# Patient Record
Sex: Female | Born: 1970 | Race: Black or African American | Hispanic: No | Marital: Married | Smoking: Never smoker
Health system: Southern US, Community
[De-identification: ages and names within clinical notes are randomized; demographics above are authoritative.]

## PROBLEM LIST (undated history)

## (undated) DIAGNOSIS — E663 Overweight: Secondary | ICD-10-CM

## (undated) DIAGNOSIS — R51 Headache: Secondary | ICD-10-CM

## (undated) DIAGNOSIS — D709 Neutropenia, unspecified: Secondary | ICD-10-CM

## (undated) DIAGNOSIS — Z0000 Encounter for general adult medical examination without abnormal findings: Secondary | ICD-10-CM

## (undated) DIAGNOSIS — F418 Other specified anxiety disorders: Secondary | ICD-10-CM

## (undated) DIAGNOSIS — F419 Anxiety disorder, unspecified: Secondary | ICD-10-CM

## (undated) DIAGNOSIS — D649 Anemia, unspecified: Secondary | ICD-10-CM

## (undated) DIAGNOSIS — F329 Major depressive disorder, single episode, unspecified: Secondary | ICD-10-CM

## (undated) DIAGNOSIS — Z124 Encounter for screening for malignant neoplasm of cervix: Principal | ICD-10-CM

## (undated) DIAGNOSIS — R030 Elevated blood-pressure reading, without diagnosis of hypertension: Secondary | ICD-10-CM

## (undated) HISTORY — PX: TUBAL LIGATION: SHX77

## (undated) HISTORY — DX: Elevated blood-pressure reading, without diagnosis of hypertension: R03.0

## (undated) HISTORY — DX: Major depressive disorder, single episode, unspecified: F32.9

## (undated) HISTORY — DX: Encounter for general adult medical examination without abnormal findings: Z00.00

## (undated) HISTORY — DX: Overweight: E66.3

## (undated) HISTORY — DX: Encounter for screening for malignant neoplasm of cervix: Z12.4

## (undated) HISTORY — DX: Anemia, unspecified: D64.9

## (undated) HISTORY — DX: Other specified anxiety disorders: F41.8

## (undated) HISTORY — DX: Neutropenia, unspecified: D70.9

---

## 2004-12-16 ENCOUNTER — Other Ambulatory Visit: Admission: RE | Admit: 2004-12-16 | Discharge: 2004-12-16 | Payer: Self-pay | Admitting: Gynecology

## 2005-04-03 ENCOUNTER — Ambulatory Visit (HOSPITAL_COMMUNITY): Admission: RE | Admit: 2005-04-03 | Discharge: 2005-04-03 | Payer: Self-pay | Admitting: Obstetrics & Gynecology

## 2005-06-20 ENCOUNTER — Inpatient Hospital Stay (HOSPITAL_COMMUNITY): Admission: AD | Admit: 2005-06-20 | Discharge: 2005-06-20 | Payer: Self-pay | Admitting: Obstetrics & Gynecology

## 2005-06-25 ENCOUNTER — Inpatient Hospital Stay (HOSPITAL_COMMUNITY): Admission: AD | Admit: 2005-06-25 | Discharge: 2005-06-28 | Payer: Self-pay | Admitting: Obstetrics and Gynecology

## 2007-07-29 ENCOUNTER — Other Ambulatory Visit: Admission: RE | Admit: 2007-07-29 | Discharge: 2007-07-29 | Payer: Self-pay | Admitting: Obstetrics & Gynecology

## 2007-07-29 ENCOUNTER — Encounter (INDEPENDENT_AMBULATORY_CARE_PROVIDER_SITE_OTHER): Payer: Self-pay | Admitting: *Deleted

## 2007-07-29 LAB — CONVERTED CEMR LAB: TSH: 0.842 microintl units/mL

## 2007-08-04 ENCOUNTER — Encounter: Admission: RE | Admit: 2007-08-04 | Discharge: 2007-08-04 | Payer: Self-pay | Admitting: Obstetrics & Gynecology

## 2008-09-12 ENCOUNTER — Inpatient Hospital Stay (HOSPITAL_COMMUNITY): Admission: AD | Admit: 2008-09-12 | Discharge: 2008-09-12 | Payer: Self-pay | Admitting: Obstetrics and Gynecology

## 2008-12-22 ENCOUNTER — Inpatient Hospital Stay (HOSPITAL_COMMUNITY): Admission: AD | Admit: 2008-12-22 | Discharge: 2008-12-25 | Payer: Self-pay | Admitting: Obstetrics and Gynecology

## 2008-12-22 ENCOUNTER — Encounter (INDEPENDENT_AMBULATORY_CARE_PROVIDER_SITE_OTHER): Payer: Self-pay | Admitting: Obstetrics and Gynecology

## 2009-08-16 IMAGING — US US SOFT TISSUE HEAD/NECK
1 series · 14 of 25 positions shown · non-contrast
Comparison: None

CLINICAL DATA: Enlarged thyroid gland by physical examination.

THYROID ULTRASOUND
TECHNIQUE: Ultrasound examination of the thyroid gland and adjacent
soft tissues was performed.

[Series 1: us soft tissue head/neck · 0.07mm/px · 14 of 28 slices shown]
[im 1/28]
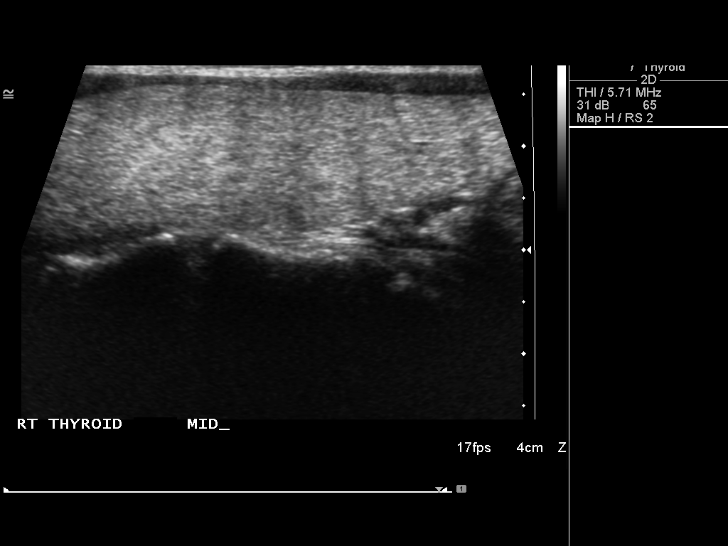
[im 3/28]
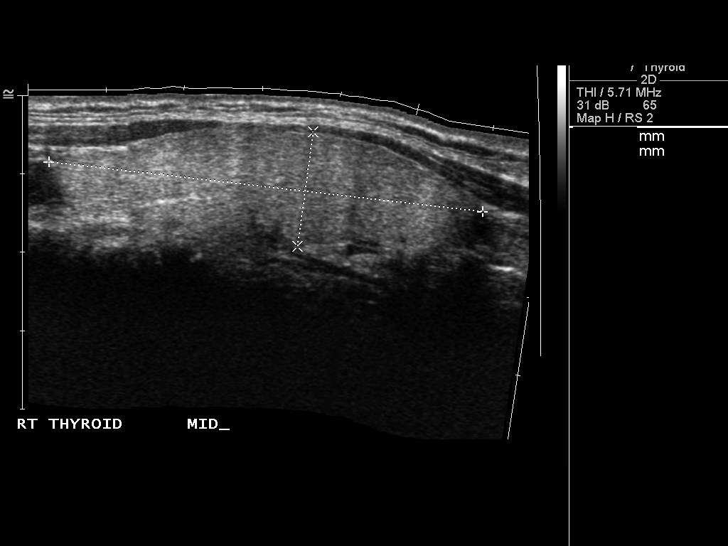
[im 5/28]
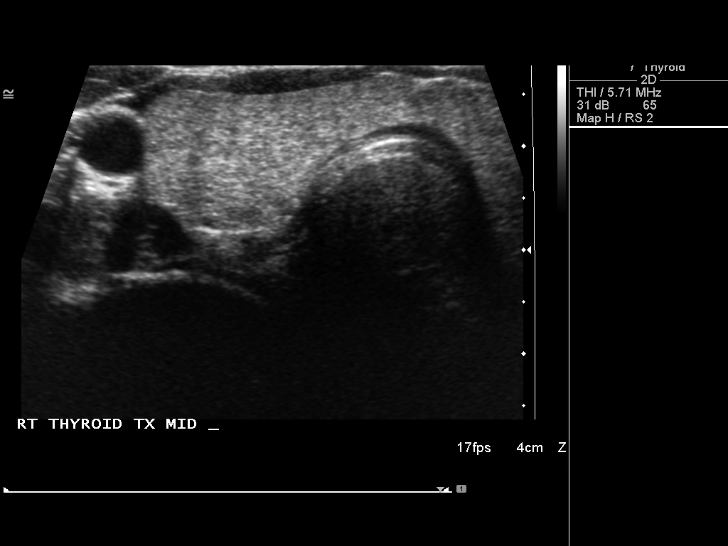
[im 7/28]
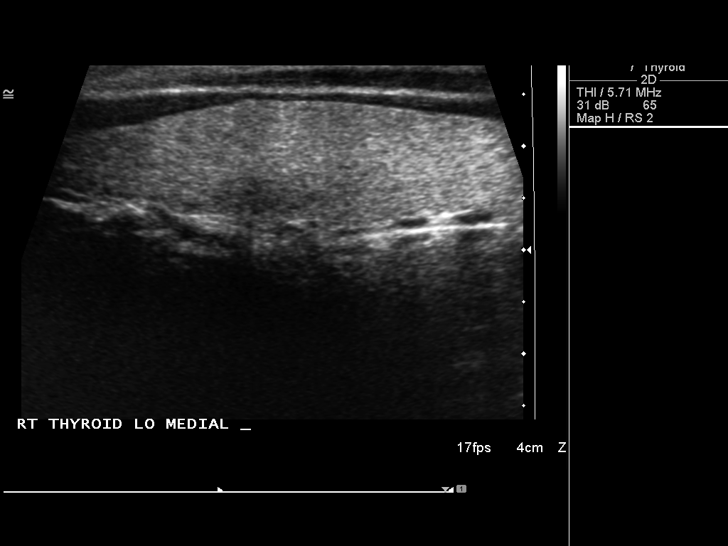
[im 10/28]
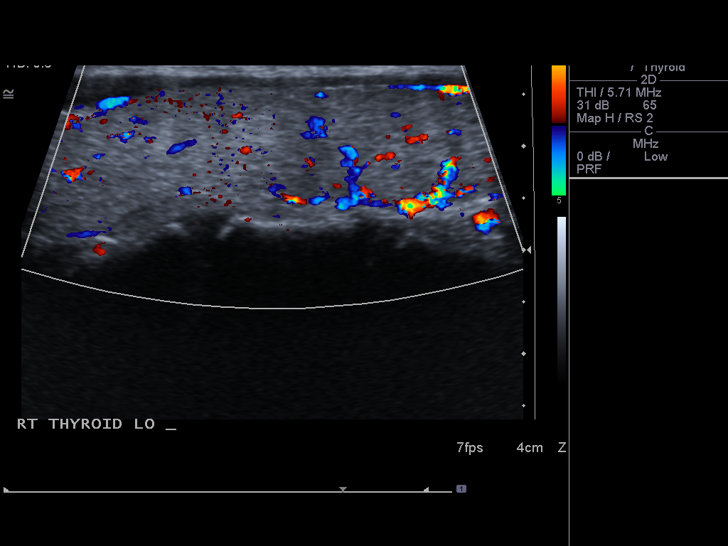
[im 11/28]
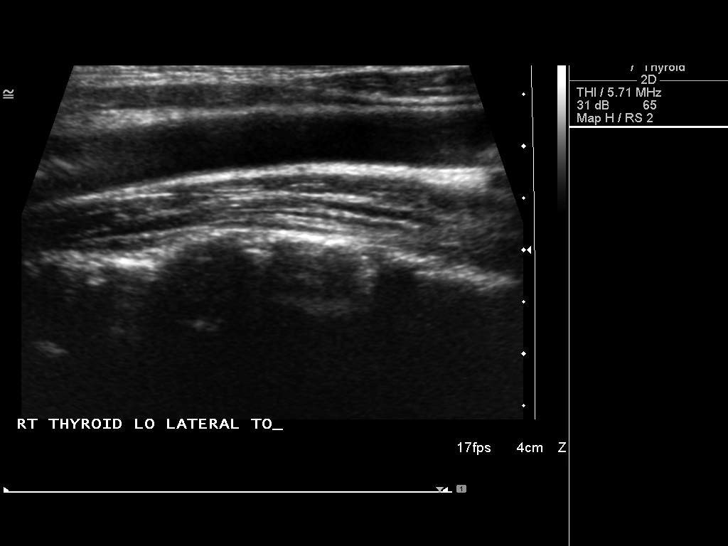
[im 13/28]
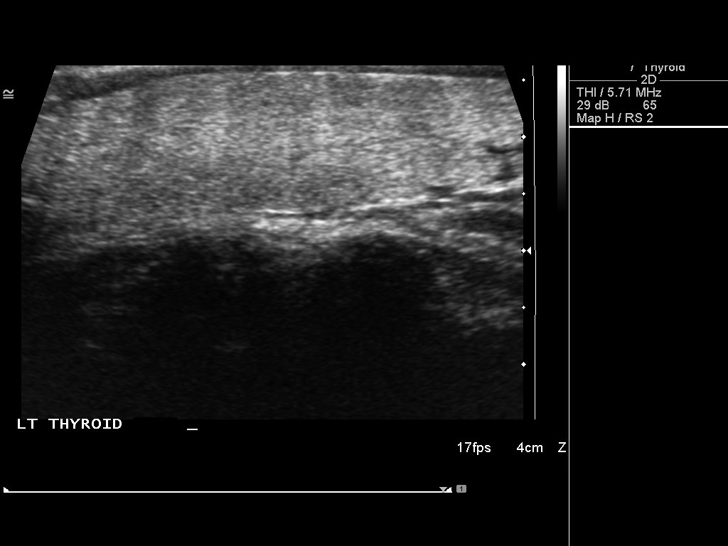
[im 15/28]
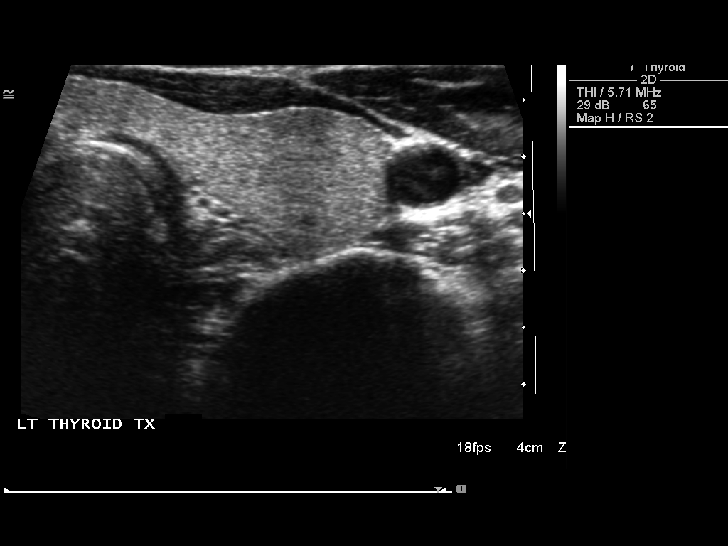
[im 17/28]
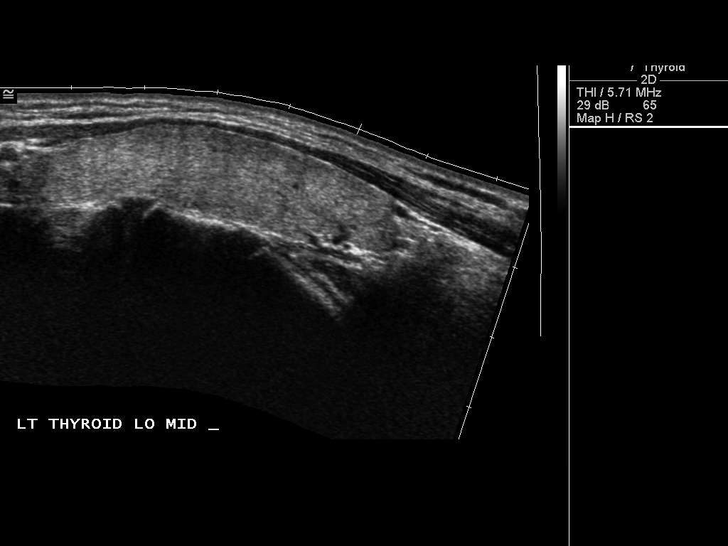
[im 19/28]
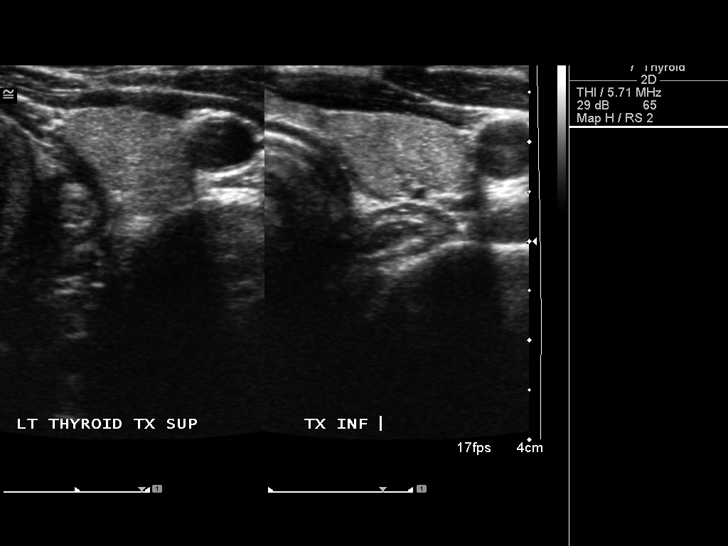
[im 21/28]
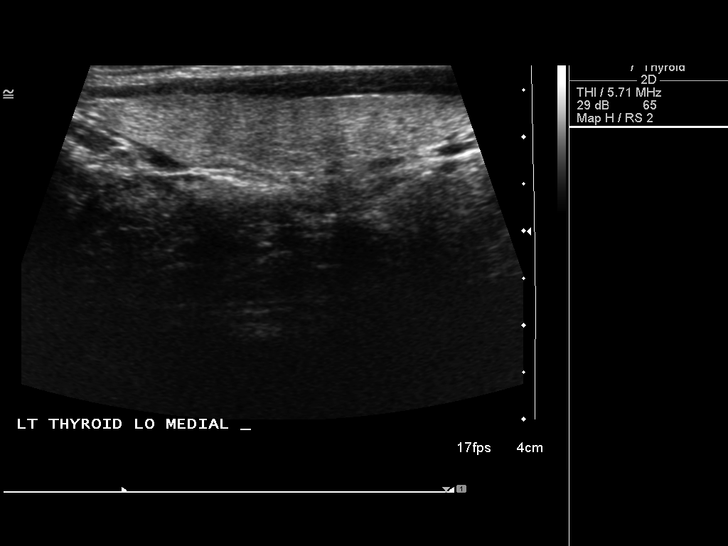
[im 23/28]
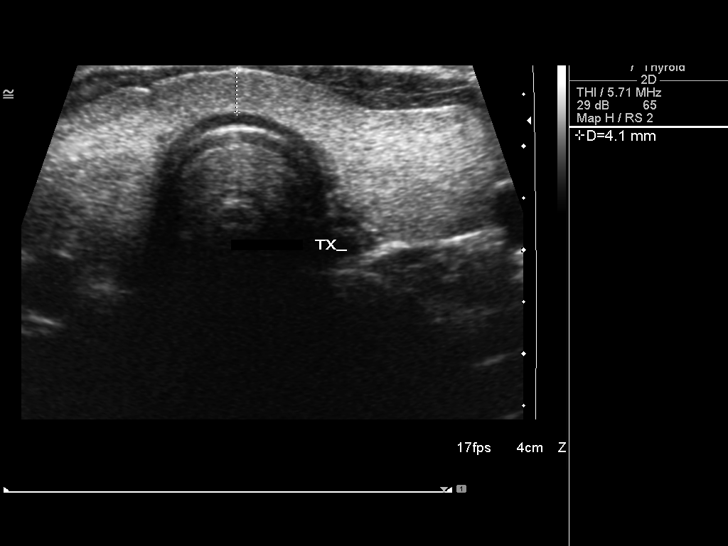
[im 25/28]
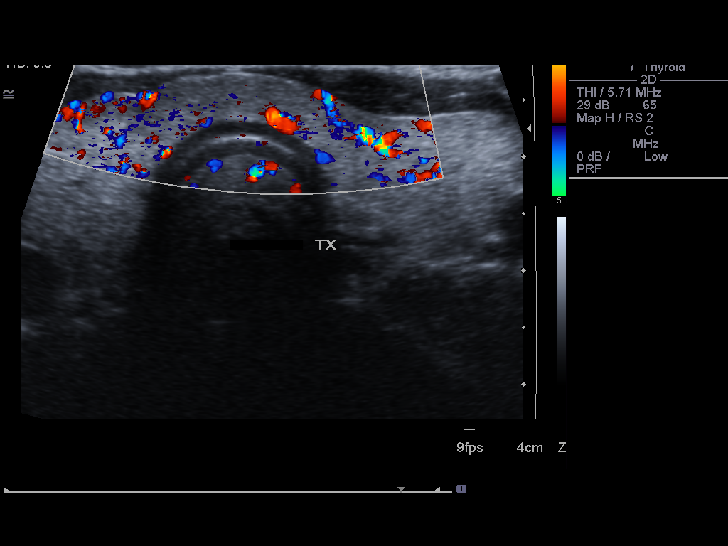
[im 28/28]
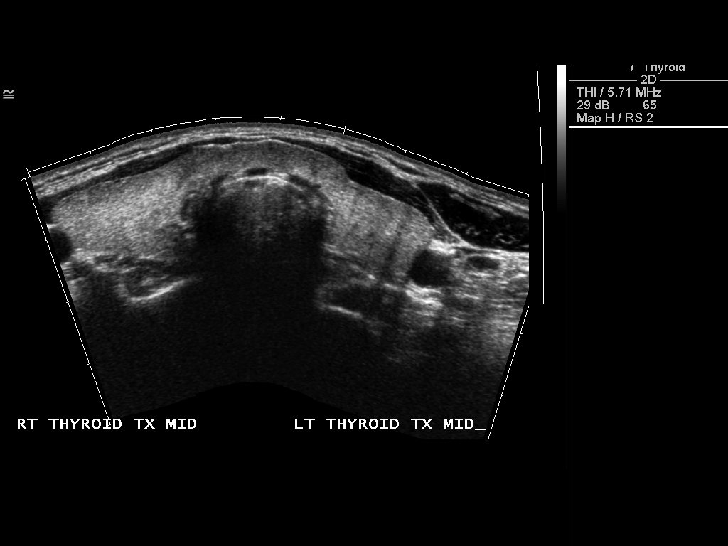

[14 of 25 positions shown; findings below may reference images not displayed]

FINDINGS: The thyroid gland is mildly and diffusely prominent in
size.  The right lobe measures 5.6 x 1.5 x 1.7 cm.  The left lobe
measures 5.4 x 1.2 x 1.8 cm.  The thyroid isthmus measures 4 mm in
thickness in the midline.

Thyroid echotexture is homogeneous.  No focal nodules or cysts are
identified.  No evidence of calcifications in the thyroid gland.
IMPRESSION: Mildly and diffusely enlarged thyroid gland with no focal lesions
identified.

## 2009-10-21 LAB — CONVERTED CEMR LAB

## 2009-11-21 LAB — HM PAP SMEAR

## 2009-12-09 ENCOUNTER — Encounter (INDEPENDENT_AMBULATORY_CARE_PROVIDER_SITE_OTHER): Payer: Self-pay | Admitting: *Deleted

## 2009-12-09 ENCOUNTER — Encounter: Payer: Self-pay | Admitting: Family Medicine

## 2009-12-09 LAB — HM MAMMOGRAPHY

## 2009-12-09 LAB — CONVERTED CEMR LAB: T3, Free: 34.8 pg/mL

## 2010-04-02 ENCOUNTER — Encounter: Payer: Self-pay | Admitting: Family Medicine

## 2010-04-02 ENCOUNTER — Ambulatory Visit
Admission: RE | Admit: 2010-04-02 | Discharge: 2010-04-02 | Payer: Self-pay | Source: Home / Self Care | Attending: Family Medicine | Admitting: Family Medicine

## 2010-04-02 DIAGNOSIS — E049 Nontoxic goiter, unspecified: Secondary | ICD-10-CM | POA: Insufficient documentation

## 2010-04-02 DIAGNOSIS — G43909 Migraine, unspecified, not intractable, without status migrainosus: Secondary | ICD-10-CM | POA: Insufficient documentation

## 2010-04-02 DIAGNOSIS — R112 Nausea with vomiting, unspecified: Secondary | ICD-10-CM | POA: Insufficient documentation

## 2010-04-02 DIAGNOSIS — Z8679 Personal history of other diseases of the circulatory system: Secondary | ICD-10-CM | POA: Insufficient documentation

## 2010-04-02 DIAGNOSIS — Z9189 Other specified personal risk factors, not elsewhere classified: Secondary | ICD-10-CM | POA: Insufficient documentation

## 2010-04-02 DIAGNOSIS — M6740 Ganglion, unspecified site: Secondary | ICD-10-CM | POA: Insufficient documentation

## 2010-04-02 DIAGNOSIS — L919 Hypertrophic disorder of the skin, unspecified: Secondary | ICD-10-CM

## 2010-04-02 DIAGNOSIS — L909 Atrophic disorder of skin, unspecified: Secondary | ICD-10-CM | POA: Insufficient documentation

## 2010-04-07 ENCOUNTER — Encounter (INDEPENDENT_AMBULATORY_CARE_PROVIDER_SITE_OTHER): Payer: Self-pay | Admitting: *Deleted

## 2010-04-07 ENCOUNTER — Telehealth (INDEPENDENT_AMBULATORY_CARE_PROVIDER_SITE_OTHER): Payer: Self-pay | Admitting: *Deleted

## 2010-04-24 NOTE — Progress Notes (Signed)
Summary: Lab results    Mammogram  Procedure date:  12/09/2009  Findings:       Assessment: BIRADS 1.   Comments:      Screening mammogram in 1 year.     -  Date:  12/09/2009    TSH: 0.685    Free T4 10.9    Free T3 34.8  Date:  07/29/2007    TSH: 0.842    Free T4 9.8    Free T3 35.7

## 2010-04-24 NOTE — Assessment & Plan Note (Signed)
Summary: migraine,vfw   Vital Signs:  Patient profile:   41 year old female Menstrual status:  regular LMP:     03/17/2010 Height:      67 inches (170.18 cm) Weight:      144.75 pounds (65.80 kg) BMI:     22.75 O2 Sat:      100 % on Room air Temp:     98.0 degrees F (36.67 degrees C) oral Pulse rate:   71 / minute BP sitting:   122 / 77  (right arm) Cuff size:   regular  Vitals Entered By: Josph Macho RMA (April 02, 2010 8:32 AM)  O2 Flow:  Room air CC: Establish new patient/ CF Is Patient Diabetic? No LMP (date): 03/17/2010     Menstrual Status regular Enter LMP: 03/17/2010 Last PAP Result historical   History of Present Illness: 40 year old American female in today for new patient appointment. She has multiple concerns. She has previously not had PND but instead has seen her OB/GYN Dr. Janae Sauce [facility] for annual exams. She denies any abnormal Paps. Had her first mammogram in August 2001 was normal. She notes her OB had noted an enlarged thyroid. After lab work, ultrasound, scans and the results showed just a benign goiter she's had no symptoms. She denies any skin changes, hair changes, temperature instability, bowel changes such as diarrhea or constipation, palpitations, chest pain, shortness of breath, anxiety or further concerns. Today she denies any recent illness, fevers, chills, GI or GU complaints. Her other concern is are a right ganglion cyst she's had for more than 4 years. She noticed it enlarges and shrinks he sometimes painful and at present is mildly uncomfortable with flexion. No redness, trauma, warmth. She also notes irritated skin tape the base of her neck on the right side which is growing and catches on her clothing in 2 weeks at present. Results in pain and irritation. She was told in 2003 heart murmur or further echo at that time her vital just mild murmur and she has had no further followup studies. She does have migraines first in 2007 frequency  drop from one every couple weeks to a frequent in 2009. She does not 2010 she had one significant enough to almost took her to the ER. She suffers with photophobia, phonophobia, floaters, nausea, vomiting, diarrhea. She uses Advil p.r.n. when she can keep it down. Has used Phenergan in the past with some good effect but takes infrequently do to sedation  Preventive Screening-Counseling & Management  Alcohol-Tobacco     Smoking Status: never  Caffeine-Diet-Exercise     Does Patient Exercise: yes      Drug Use:  no.    Current Medications (verified): 1)  None  Allergies (verified): 1)  ! Sulfa 2)  ! Doxycycline 3)  ! Erythromycin  Past History:  Past Surgical History: Caesarean section w/tubal  Family History: Father: 49, HTN, torn retinas Mother: deceased@56 , sarcoma, gyn cancer noted w/hysterectomy Siblings:  Sister: 6, A&W Brother: 10, Asthma, smoker MGM: deceased@90 , multiple myeloma MGF: deceased@92 , prostate cancer, old age PGM: late 40s, HTN PGF: 51s, glaucoma Children:  Daughter: 18yo, A&W Son: 2yo, A&W PUncle: early 59s, torn retinas  Social History: Occupation: stay at home Mom, previously Engineer, structural Married Never Smoked Alcohol use-no Drug use-no Regular exercise-yes, treadmill Seat belt use No restrictions Smoking Status:  never Occupation:  employed Drug Use:  no Does Patient Exercise:  yes  Review of Systems       The patient  complains of vision loss.  The patient denies anorexia, fever, weight loss, weight gain, decreased hearing, hoarseness, chest pain, syncope, dyspnea on exertion, peripheral edema, prolonged cough, headaches, hemoptysis, abdominal pain, melena, hematochezia, severe indigestion/heartburn, hematuria, incontinence, genital sores, muscle weakness, suspicious skin lesions, transient blindness, difficulty walking, unusual weight change, abnormal bleeding, enlarged lymph nodes, and breast masses.    Physical Exam  General:   Well-developed,well-nourished,in no acute distress; alert,appropriate and cooperative throughout examination Head:  Normocephalic and atraumatic without obvious abnormalities. No apparent alopecia or balding. Eyes:  No corneal or conjunctival inflammation noted. EOMI.  PERRLA Ears:  External ear exam shows no significant lesions or deformities.  Otoscopic examination reveals clear canals, tympanic membranes are intact bilaterally without bulging, retraction, inflammation or discharge. Hearing is grossly normal bilaterally. Nose:  External nasal examination shows no deformity or inflammation. Nasal mucosa are pink and moist without lesions or exudates. Mouth:  Oral mucosa and oropharynx without lesions or exudates.  Teeth in good repair. Neck:  No deformities,  or tenderness noted. Lungs:  Normal respiratory effort, chest expands symmetrically. Lungs are clear to auscultation, no crackles or wheezes. Heart:  Normal rate and regular rhythm. S1 and S2 normal without gallop, murmur, click, rub or other extra sounds. Abdomen:  Bowel sounds positive,abdomen soft and non-tender without masses, organomegaly or hernias noted. Msk:  No deformity or scoliosis noted of thoracic or lumbar spine.   Pulses:  R and L carotid,radial,femoral,dorsalis pedis and posterior tibial pulses are full and equal bilaterally Extremities:  No clubbing, cyanosis, edema, or deformity noted with normal full range of motion of all joints.  small ganglion cyst, firm, minimal mobile on dorsal surface of right wrist, at thenar aspect of wrist. 1/2 to 1 cm in diameter, no erythema, tenderness Neurologic:  No cranial nerve deficits noted. Station and gait are normal. Plantar reflexes are down-going bilaterally. DTRs are symmetrical throughout. Sensory, motor and coordinative functions appear intact. Skin:  Intact without suspicious lesions or rashes. Small pedunculated skin tag with some mild erythema at the base noted over right lateral  neck at base Cervical Nodes:  No lymphadenopathy noted Psych:  Cognition and judgment appear intact. Alert and cooperative with normal attention span and concentration. No apparent delusions, illusions, hallucinations   Detailed Thyroid Exam    Inspection: visible goiter.      Palpation: diffuse goiter and soft.      Mobility: moves upward normally with swallowing with no tethering.     Impression & Recommendations:  Problem # 1:  UNSPECIFIED HYPERTROPHIC&ATROPHIC CONDITION SKIN (ICD-701.9) Lesion at right side base of neck, cleaned with alcohol, bubjected to cryotherapy and then excised with sterile scissors, patient tolerated the procedure well. Lesion cleaned again with alcohol and very scant bleeding was stopped with one application of silver nitrite. Sterile bandage applied  Problem # 2:  NAUSEA WITH VOMITING (ICD-787.01) Patient with significant symptoms when she has migraines encouraged to try Excedrine Migraine or Advil early in the symptoms, given an rx for some Promethazine supp to use as needed for the nausea and vomitting  Problem # 3:  GANGLION CYST, WRIST, RIGHT (ICD-727.41) Encouraged ice two times a day and vigorous massage with Aspercreme following this if no resolution or symptoms worsening may call for referral and further instructions  Problem # 4:  GOITER, UNSPECIFIED (ICD-240.9) No concerning findings on exam patient agrees to release of records from her OB/GYN to further evaluate and we will repeat TSH when she returns for fasting annual exams prior to  next visit.  Complete Medication List: 1)  Promethazine Hcl 25 Mg Supp (Promethazine hcl) .Marland Kitchen.. 1 supp pr q 8 hours as needed n/v  Patient Instructions: 1)  Please schedule a follow-up appointment in 1 to 2 months 2)  Release of Records [facility] in [place] 3)  Release of Records Dr Darl Pikes (?) [facility] at [facility], labs 4)  BMP prior to visit, ICD-9: use v 70.0 5)  Hepatic Panel prior to visit  ICD-9:  6)  Lipid panel prior to visit ICD-9 :  7)  TSH prior to visit ICD-9 :  8)  CBC w/ Diff prior to visit ICD-9 :  9)  For neck, shower normally and leave open to air, if bleeds may apply bandage as needed, call with any concerns, redness or swelling Prescriptions: PROMETHAZINE HCL 25 MG SUPP (PROMETHAZINE HCL) 1 supp pr q 8 hours as needed n/v  #10 x 1   Entered and Authorized by:   Danise Edge MD   Signed by:   Danise Edge MD on 04/02/2010   Method used:   Electronically to        [facility]  [address] [phone]* (retail)       [address] [place], [place]  [address]       Ph: [phone] or [phone]       Fax: [phone]   RxID:   [id number]    Orders Added: 1)  New Patient Level III [03474]    Preventive Care Screening  Mammogram:    Date:  10/21/2009    Results:  historical   Pap Smear:    Date:  10/21/2009    Results:  historical   Last Tetanus Booster:    Date:  03/23/2002    Results:  Historical    Appended Document: migraine,vfw Faxed medical record requests to [facility] & Dr Hyacinth Meeker - dt

## 2010-04-24 NOTE — Letter (Signed)
Summary: [facility]  [facility]   Imported By: Lanelle Bal 04/08/2010 11:20:06  _____________________________________________________________________  External Attachment:    Type:   Image     Comment:   External Document

## 2010-04-24 NOTE — Letter (Signed)
SummaryGinette Dunlap [facility]  [facility] 2009-2011   Imported By: Lester  04/11/2010 08:00:42  _____________________________________________________________________  External Attachment:    Type:   Image     Comment:   External Document

## 2010-04-25 ENCOUNTER — Encounter: Payer: Self-pay | Admitting: Family Medicine

## 2010-06-01 ENCOUNTER — Encounter: Payer: Self-pay | Admitting: Family Medicine

## 2010-06-10 ENCOUNTER — Ambulatory Visit: Payer: Self-pay | Admitting: Family Medicine

## 2010-06-26 LAB — CBC
HCT: 28.6 % — ABNORMAL LOW (ref 36.0–46.0)
HCT: 35.1 % — ABNORMAL LOW (ref 36.0–46.0)
MCV: 90.1 fL (ref 78.0–100.0)
MCV: 91.5 fL (ref 78.0–100.0)
Platelets: 185 10*3/uL (ref 150–400)
RBC: 3.13 MIL/uL — ABNORMAL LOW (ref 3.87–5.11)
RDW: 15.5 % (ref 11.5–15.5)

## 2010-08-07 ENCOUNTER — Ambulatory Visit (INDEPENDENT_AMBULATORY_CARE_PROVIDER_SITE_OTHER): Payer: 59 | Admitting: Family Medicine

## 2010-08-07 ENCOUNTER — Encounter: Payer: Self-pay | Admitting: Family Medicine

## 2010-08-07 DIAGNOSIS — F329 Major depressive disorder, single episode, unspecified: Secondary | ICD-10-CM

## 2010-08-07 DIAGNOSIS — R5383 Other fatigue: Secondary | ICD-10-CM

## 2010-08-07 DIAGNOSIS — R635 Abnormal weight gain: Secondary | ICD-10-CM

## 2010-08-07 DIAGNOSIS — E049 Nontoxic goiter, unspecified: Secondary | ICD-10-CM

## 2010-08-07 DIAGNOSIS — F3289 Other specified depressive episodes: Secondary | ICD-10-CM

## 2010-08-07 DIAGNOSIS — R112 Nausea with vomiting, unspecified: Secondary | ICD-10-CM

## 2010-08-07 DIAGNOSIS — R5381 Other malaise: Secondary | ICD-10-CM

## 2010-08-07 LAB — CBC WITH DIFFERENTIAL/PLATELET
Basophils Absolute: 0 10*3/uL (ref 0.0–0.1)
Basophils Relative: 0.5 % (ref 0.0–3.0)
Eosinophils Absolute: 0.2 10*3/uL (ref 0.0–0.7)
HCT: 37.2 % (ref 36.0–46.0)
Hemoglobin: 12.7 g/dL (ref 12.0–15.0)
Lymphs Abs: 1.8 10*3/uL (ref 0.7–4.0)
MCHC: 34.1 g/dL (ref 30.0–36.0)
MCV: 89.7 fl (ref 78.0–100.0)
Monocytes Absolute: 0.3 10*3/uL (ref 0.1–1.0)
Neutro Abs: 4.6 10*3/uL (ref 1.4–7.7)
RBC: 4.14 Mil/uL (ref 3.87–5.11)
RDW: 12.2 % (ref 11.5–14.6)

## 2010-08-07 LAB — RENAL FUNCTION PANEL
Albumin: 3.7 g/dL (ref 3.5–5.2)
BUN: 13 mg/dL (ref 6–23)
Chloride: 106 mEq/L (ref 96–112)
Creatinine, Ser: 0.6 mg/dL (ref 0.4–1.2)
Phosphorus: 3.5 mg/dL (ref 2.3–4.6)

## 2010-08-07 LAB — HEPATIC FUNCTION PANEL
Albumin: 3.7 g/dL (ref 3.5–5.2)
Alkaline Phosphatase: 61 U/L (ref 39–117)
Total Protein: 7.3 g/dL (ref 6.0–8.3)

## 2010-08-07 MED ORDER — CITALOPRAM HYDROBROMIDE 10 MG PO TABS: 10.0000 mg | ORAL_TABLET | Freq: Every day | ORAL | Status: DC

## 2010-08-07 MED ORDER — BUTALBITAL-ACETAMINOPHEN 50-650 MG PO TABS: 1.0000 | ORAL_TABLET | Freq: Four times a day (QID) | ORAL | Status: DC | PRN

## 2010-08-07 MED ORDER — BUTALBITAL-APAP-CAFFEINE 50-300-40 MG PO CAPS: 1.0000 | ORAL_CAPSULE | Freq: Four times a day (QID) | ORAL | Status: DC | PRN

## 2010-08-07 MED ORDER — ALPRAZOLAM 0.25 MG PO TABS: ORAL_TABLET | ORAL | Status: DC

## 2010-08-07 NOTE — Patient Instructions (Signed)
Depression You have signs of depression. This is a common problem. It can occur at any age. It is often hard to recognize. People can suffer from depression and still have moments of enjoyment. Depression interferes with your basic ability to function in life. It upsets your relationships, sleep, eating, and work habits. CAUSES Depression is believed to be caused by an imbalance in brain chemicals. It may be triggered by an unpleasant event. Relationship crises, a death in the family, financial worries, retirement, or other stressors are normal causes of depression. Depression may also start for no known reason. Other factors that may play a part include medical illnesses, some medicines, genetics, and alcohol or drug abuse. SYMPTOMS  Feeling unhappy or worthless.   Long-lasting (chronic) tiredness or worn-out feeling.   Self-destructive thoughts and actions.   Not being able to sleep or sleeping too much.   Eating more than usual or not eating at all.   Headaches or feeling anxious.   Trouble concentrating or making decisions.   Unexplained physical problems and substance abuse.  TREATMENT Depression usually gets better with treatment. This can include:  Antidepressant medicines. It can take weeks before the proper dose is achieved and benefits are reached.   Talking with a therapist, clergyperson, counselor, or friend. These people can help you gain insight into your problem and regain control of your life.   Eating a good diet.   Getting regular physical exercise, such as walking for 30 minutes every day.   Not abusing alcohol or drugs.  Treating depression often takes 6 months or longer. This length of treatment is needed to keep symptoms from returning. Call your caregiver and arrange for follow-up care as suggested. SEEK IMMEDIATE MEDICAL CARE IF:  You start to have thoughts of hurting yourself or others.   Call your local emergency services (911 in [place]   Go to your  local medical emergency department.   Call the National Suicide Prevention Lifeline: 1-800-273-TALK (1-800-273-8255).  Document Released: 03/09/2005 Document Re-Released: 08/27/2009 Exit[employer] Patient Information 2011 [employer]. 

## 2010-08-08 NOTE — Op Note (Signed)
NAMESILVERIA, Lynn Dunlap       ACCOUNT NO.:  [id number]   MEDICAL RECORD NO.:  [id number]          PATIENT TYPE:  INP   LOCATION:  9146                          FACILITY:  [facility]   PHYSICIAN:  Randye Lobo, M.D.   DATE OF BIRTH:  23-Sep-1970   DATE OF PROCEDURE:  06/26/2005  DATE OF DISCHARGE:                                 OPERATIVE REPORT   PREOPERATIVE DIAGNOSIS:  1.  Intrauterine gestation at 58 plus 6 weeks.  2.  Severe fetal variable decelerations   POSTOPERATIVE DIAGNOSIS:  1.  Intrauterine gestation at 4 plus 6 weeks.  2.  Severe fetal variable heart rate decelerations.   PROCEDURE:  Vacuum-assisted vaginal delivery with midline episiotomy and  repair.   SURGEON:  Randye Lobo, M.D.   ANESTHESIA:  Epidural.   ESTIMATED BLOOD LOSS:  350 mL.   COMPLICATIONS:  None.   INDICATIONS FOR PROCEDURE:  The patient is a 40 year old gravida 2, para 0-0-  1-0 African-American female who presented at 1 plus 6 weeks' gestation with  contractions, nausea and vomiting.  The patient was noted to be 3 cm dilated  upon admission.  The patient did receive some Phenergan for nausea.  She was  noted to have contractions and she was admitted in early labor.  The patient  went on to receive an epidural for anesthesia.  The patient progressed  throughout her active phase of labor and after receiving some Pitocin  augmentation.  Intermittently, the fetal heart rate did demonstrate deep  variable decelerations which were treated with resuscitation measures.   Ultimately, the severity of fetal heart rate decelerations did recur and the  patient was noted to be completely dilated at this time.  At this point, a  recommendation was made to proceed with a vacuum-assisted vaginal delivery  which was ultimately accompanied by a midline episiotomy.  The risks and  benefits were reviewed with the patient and she chose to proceed.   FINDINGS:  A viable female was delivered at 1324 in the left  occiput  anterior position with a foot cord appreciated.  Apgars were 9 at 1 minute  and 9 at 5 minutes.  The newborn was noted to be vigorous.  The placenta was  noted to be intact and have a normal insertion of a three-vessel cord.  There was a partial third degree extension of the midline episiotomy.   PROCEDURE:  The patient was examined and noted to be completely dilated with  the vertex at a 2+ station in the left occiput anterior position.  The  patient was making only a moderate pushing effort.  The Foley catheter was  removed and the patient was prepared for a vacuum-assisted vaginal delivery.  The Mityvac was applied with the vertex at a 2+ station and was used over  two efforts.  The vertex was crowning at this time but the patient was  unable to deliver and a midline episiotomy was performed as the heart rate  was in the 70s at this time.  The patient was then able to push and  ultimately delivered the vertex spontaneously.  The remainder of the newborn  was delivered without difficulty.  The nares and mouth were suctioned.  The  cord was doubly clamped and cut and the newborn was carried over to the baby  warmer in good condition.   The placenta was delivered spontaneously and intact.  Cord blood was  obtained and a cord blood donation was also performed.  The cervix and  vagina were examined and noted to be intact.  There was a partial third  degree extension of the midline episiotomy which went through the capsule  and included a few of the muscle fibers.  A repair was performed using a  combination of 0 chromic and 2-0 chromic in standard fashion.   This concluded the patient's procedure.  There were no complications.  All  needle, instrument, sponge counts were correct.      Randye Lobo, M.D.  Electronically Signed     BES/MEDQ  D:  06/26/2005  T:  06/26/2005  Job:  [id number]

## 2010-08-10 ENCOUNTER — Encounter: Payer: Self-pay | Admitting: Family Medicine

## 2010-08-10 DIAGNOSIS — F32A Depression, unspecified: Secondary | ICD-10-CM

## 2010-08-10 DIAGNOSIS — F418 Other specified anxiety disorders: Secondary | ICD-10-CM

## 2010-08-10 HISTORY — DX: Depression, unspecified: F32.A

## 2010-08-10 HISTORY — DX: Other specified anxiety disorders: F41.8

## 2010-08-10 NOTE — Progress Notes (Signed)
Lynn Dunlap [id number] Jul 22, 1970 08/10/2010      Progress Note-Follow Up  Subjective  Chief Complaint  Chief Complaint  Patient presents with  . Follow-up    on medication    HPI  Patient is in today for follow up on her new patient appt. She is physically feeling better. She denies any GI c/o. No anorexia/n/v/abd pain/f/c/diarhea or constipation. Unfortunately she is very sad and tearful today. She has been unemployed for [employer] and now her husband has just gone back over seas to work in [place] for more than the next year. She is at home with her two young children and is struggling with depression. She denies any suicidal ideation or homicidal ideation. She is noting anhedonia, irritability and fatigue as her major concerns. She notes it has been getting worse over the past couple of months. She has never taken any meds for this in the past. She denies any CP/pain/SOB/GI or GU c/o.  Past Medical History  Diagnosis Date  . Depression 08/10/2010    Past Surgical History  Procedure Date  . Cesarean section     X 1 w/ tubal    Family History  Problem Relation Age of Onset  . Cancer Mother     sarcoma, gyn cancer w/ hysterectomy  . Hypertension Father   . Asthma Brother   . Cancer Maternal Grandfather     prostate  . Hyperlipidemia Paternal Grandmother   . Glaucoma Paternal Grandfather     History   Social History  . Marital Status: Married    Spouse Name: N/A    Number of Children: N/A  . Years of Education: N/A   Occupational History  . Not on file.   Social History Main Topics  . Smoking status: Never Smoker   . Smokeless tobacco: Never Used  . Alcohol Use: No  . Drug Use: No  . Sexually Active: Yes -- Female partner(s)   Other Topics Concern  . Not on file   Social History Narrative  . No narrative on file    Current Outpatient Prescriptions on File Prior to Visit  Medication Sig Dispense Refill  . promethazine (PHENERGAN) 25 MG  suppository Place 25 mg rectally every 8 (eight) hours as needed. For nausea and vomitting         Allergies  Allergen Reactions  . Doxycycline   . Erythromycin   . Sulfonamide Derivatives     Review of Systems  Review of Systems  Constitutional: Negative for fever and malaise/fatigue.  HENT: Negative for congestion.   Eyes: Negative for discharge.  Respiratory: Negative for shortness of breath.   Cardiovascular: Negative for chest pain, palpitations and leg swelling.  Gastrointestinal: Negative for nausea, abdominal pain and diarrhea.  Genitourinary: Negative for dysuria.  Musculoskeletal: Negative for falls.  Skin: Negative for rash.  Neurological: Negative for loss of consciousness and headaches.  Endo/Heme/Allergies: Negative for polydipsia.  Psychiatric/Behavioral: Positive for depression. Negative for suicidal ideas, memory loss and substance abuse. The patient has insomnia. The patient is not nervous/anxious.     Objective  BP 139/88  Pulse 76  Temp(Src) 97.9 F (36.6 C) (Oral)  Ht 5\' 7"  (1.702 m)  Wt 153 lb 6.4 oz (69.582 kg)  BMI 24.03 kg/m2  SpO2 100%  LMP 07/08/2010  Physical Exam Physical Exam  Constitutional: She is oriented to person, place, and time and well-developed, well-nourished, and in no distress. No distress.  HENT:  Head: Normocephalic and atraumatic.  Eyes: Conjunctivae are normal.  Neck: Neck supple. No thyromegaly present.  Cardiovascular: Normal rate, regular rhythm and normal heart sounds.   No murmur heard. Pulmonary/Chest: Effort normal and breath sounds normal. She has no wheezes.  Abdominal: She exhibits no distension and no mass.  Musculoskeletal: She exhibits no edema.  Lymphadenopathy:    She has no cervical adenopathy.  Neurological: She is alert and oriented to person, place, and time.  Skin: Skin is warm and dry. No rash noted. She is not diaphoretic.  Psychiatric: Memory, affect and judgment normal.    Lab Results    Component Value Date   TSH 0.90 08/07/2010   Lab Results  Component Value Date   WBC 6.9 08/07/2010   HGB 12.7 08/07/2010   HCT 37.2 08/07/2010   MCV 89.7 08/07/2010   PLT 176.0 08/07/2010   Lab Results  Component Value Date   CREATININE 0.6 08/07/2010   BUN 13 08/07/2010   NA 138 08/07/2010   K 4.4 08/07/2010   CL 106 08/07/2010   CO2 27 08/07/2010   Lab Results  Component Value Date   ALT 13 08/07/2010   AST 14 08/07/2010   ALKPHOS 61 08/07/2010   BILITOT 0.6 08/07/2010    Assessment & Plan  NAUSEA WITH VOMITING No recent difficulty. Has not needed her Phenergan.  Depression Patient very tearful today. She has been unemployed for quite some time and her husband has just left for another tour over seas to help pay the bills and he will be gone for over a year. She is home alone with her two young children without any support system in place. She is in agreement that it is time to try medications. She does not feel she wants to try counseling at the present time. We will start Citalopram at just 10mg  and reevaluate in 1 month or sooner as needed. Is given just a few Alprazolam to try prn for severe agitaiton  GOITER, UNSPECIFIED She agrees to fasting labs and a TSH today    .

## 2010-08-10 NOTE — Assessment & Plan Note (Signed)
No recent difficulty. Has not needed her Phenergan.

## 2010-08-10 NOTE — Assessment & Plan Note (Signed)
She agrees to fasting labs and a TSH today

## 2010-08-10 NOTE — Assessment & Plan Note (Signed)
Patient very tearful today. She has been unemployed for quite some time and her husband has just left for another tour over seas to help pay the bills and he will be gone for over a year. She is home alone with her two young children without any support system in place. She is in agreement that it is time to try medications. She does not feel she wants to try counseling at the present time. We will start Citalopram at just 10mg  and reevaluate in 1 month or sooner as needed. Is given just a few Alprazolam to try prn for severe agitaiton

## 2010-09-17 ENCOUNTER — Ambulatory Visit (INDEPENDENT_AMBULATORY_CARE_PROVIDER_SITE_OTHER): Payer: 59 | Admitting: Family Medicine

## 2010-09-17 ENCOUNTER — Encounter: Payer: Self-pay | Admitting: Family Medicine

## 2010-09-17 DIAGNOSIS — R112 Nausea with vomiting, unspecified: Secondary | ICD-10-CM

## 2010-09-17 DIAGNOSIS — F329 Major depressive disorder, single episode, unspecified: Secondary | ICD-10-CM

## 2010-09-17 DIAGNOSIS — G43909 Migraine, unspecified, not intractable, without status migrainosus: Secondary | ICD-10-CM

## 2010-09-17 DIAGNOSIS — F32A Depression, unspecified: Secondary | ICD-10-CM

## 2010-09-17 MED ORDER — BUTALBITAL-APAP-CAFFEINE 50-300-40 MG PO CAPS: 1.0000 | ORAL_CAPSULE | Freq: Four times a day (QID) | ORAL | Status: DC | PRN

## 2010-09-17 MED ORDER — CITALOPRAM HYDROBROMIDE 10 MG PO TABS: 10.0000 mg | ORAL_TABLET | Freq: Every day | ORAL | Status: DC

## 2010-09-17 NOTE — Assessment & Plan Note (Signed)
The patient is in today feeling much better on citalopram 10 mg. She reports she is concentrating better her mood is elevating and she is much more patient with the people around her. She would like to state at the same dose at the present time. She's not had any concerning side effects. Refills are sent in for citalopram 10 mg daily and she will call she has any concerns otherwise we will followup in 3-4 months

## 2010-09-17 NOTE — Patient Instructions (Signed)
Headaches, FAQs   (Frequently Asked Questions)   MIGRAINE HEADACHES   Q: What is migraine? What causes it? How can I treat it?   A: Generally, migraine headaches begin as a dull ache. Then they develop into a constant, throbbing, and pulsating pain. You may experience pain at the temples. You may experience pain at the front or back of one or both sides of the head. The pain is usually accompanied by a combination of:   Nausea.  Vomiting.  Sensitivity to light and noise.    Some people (about 15%) experience an aura (see below) before an attack. The cause of migraine is believed to be chemical reactions in the brain. Treatment for migraine may include over-the-counter or prescription medications. It may also include self-help techniques. These include relaxation training and biofeedback.   Q: What is an aura?   A: About 15% of people with migraine get an “aura”. This is a sign of neurological symptoms that occur before a migraine headache. You may see wavy or jagged lines, dots, or flashing lights. You might experience tunnel vision or blind spots in one or both eyes. The aura can include visual or auditory hallucinations (something imagined). It may include disruptions in smell (such as strange odors), taste or touch. Other symptoms include:   Numbness.   A “pins and needles” sensation.   Difficulty in recalling or speaking the correct word.   These neurological events may last as long as 60 minutes. These symptoms will fade as the headache begins.   Q: What is a trigger?   A: Certain physical or environmental factors can lead to or “trigger” a migraine. These include:   Foods.   Hormonal changes.  Weather.   Stress.    It is important to remember that triggers are different for everyone. To help prevent migraine attacks, you need to figure out which triggers affect you. Keep a headache diary. This is a good way to track triggers. The diary will help you talk to your healthcare professional about your condition.    Q: Does weather affect migraines?   A: Bright sunshine, hot, humid conditions, and drastic changes in barometric pressure may lead to, or “trigger,” a migraine attack in some people. But studies have shown that weather does not act as a trigger for everyone with migraines.   Q: What is the link between migraine and hormones?   A: Hormones start and regulate many of your body's functions. Hormones keep your body in balance within a constantly changing environment. The levels of hormones in your body are unbalanced at times. Examples are during menstruation, pregnancy, or menopause. That can lead to a migraine attack. In fact, about three quarters of all women with migraine report that their attacks are related to the menstrual cycle.   Q: Is there an increased risk of stroke for migraine sufferers?   A: The likelihood of a migraine attack causing a stroke is very remote. That is not to say that migraine sufferers cannot have a stroke associated with their migraines. In persons under age 40, the most common associated factor for stroke is migraine headache. But over the course of a person's normal life span, the occurrence of migraine headache may actually be associated with a reduced risk of dying from cerebrovascular disease due to stroke.   Q: What are acute medications for migraine?   A: Acute medications are used to treat the pain of the headache after it has started. Examples over-the-counter medications, NSAIDs, ergots,   and triptans.   Q: What are the triptans?   A: Triptans are the newest class of abortive medications. They are specifically targeted to treat migraine. Triptans are vasoconstrictors. They moderate some chemical reactions in the brain. The triptans work on receptors in your brain. Triptans help to restore the balance of a neurotransmitter called serotonin. Fluctuations in levels of serotonin are thought to be a main cause of migraine.   Q: Are over-the-counter medications for migraine  effective?   A: Over-the-counter, or “OTC,” medications may be effective in relieving mild to moderate pain and associated symptoms of migraine. But you should see your caregiver before beginning any treatment regimen for migraine.   Q: What are preventive medications for migraine?   A: Preventive medications for migraine are sometimes referred to as “prophylactic” treatments. They are used to reduce the frequency, severity, and length of migraine attacks. Examples of preventive medications include antiepileptic medications, antidepressants, beta-blockers, calcium channel blockers, and NSAIDs (nonsteroidal anti-inflammatory drugs).   Q: Why are anticonvulsants used to treat migraine?   A: During the past few years, there has been an increased interest in antiepileptic drugs for the prevention of migraine. They are sometimes referred to as “anticonvulsants”. Both epilepsy and migraine may be caused by similar reactions in the brain.   Q: Why are antidepressants used to treat migraine?   A: Antidepressants are typically used to treat people with depression. They may reduce migraine frequency by regulating chemical levels, such as serotonin, in the brain.   Q: What alternative therapies are used to treat migraine?   A: The term “alternative therapies” is often used to describe treatments considered outside the scope of conventional Western medicine. Examples of alternative therapy include acupuncture, acupressure, and yoga. Another common alternative treatment is herbal therapy. Some herbs are believed to relieve headache pain. Always discuss alternative therapies with your caregiver before proceeding. Some herbal products contain arsenic and other toxins.   TENSION HEADACHES   Q: What is a tension-type headache? What causes it? How can I treat it?   A: Tension-type headaches occur randomly. They are often the result of temporary stress, anxiety, fatigue, or anger. Symptoms include soreness in your temples, a  tightening band-like sensation around your head (a “vice-like” ache). Symptoms can also include a pulling feeling, pressure sensations, and contracting head and neck muscles. The headache begins in your forehead, temples, or the back of your head and neck. Treatment for tension-type headache may include over-the-counter or prescription medications. Treatment may also include self-help techniques such as relaxation training and biofeedback.   CLUSTER HEADACHES   Q: What is a cluster headache? What causes it? How can I treat it?   A: Cluster headache gets its name because the attacks come in groups. The pain arrives with little, if any, warning. It is usually on one side of the head. A tearing or bloodshot eye and a runny nose on the same side of the headache may also accompany the pain. Cluster headaches are believed to be caused by chemical reactions in the brain. They have been described as the most severe and intense of any headache type. Treatment for cluster headache includes prescription medication and oxygen.   SINUS HEADACHES   Q: What is a sinus headache? What causes it? How can I treat it?   A: When a cavity in the bones of the face and skull (a sinus) becomes inflamed, the inflammation will cause localized pain. This condition is usually the result of an allergic reaction,   a tumor, or an infection. If your headache is caused by a sinus blockage, such as an infection, you will probably have a fever. An x-ray will confirm a sinus blockage. Your caregiver's treatment might include antibiotics for the infection, as well as antihistamines or decongestants.   REBOUND HEADACHES   Q: What is a rebound headache? What causes it? How can I treat it?   A: A pattern of taking acute headache medications too often can lead to a condition known as “rebound headache.” A pattern of taking too much headache medication includes taking it more than 2 days per week or in excessive amounts. That means more than the label or a  caregiver advises. With rebound headaches, your medications not only stop relieving pain, they actually begin to cause headaches. Doctors treat rebound headache by tapering the medication that is being overused. Sometimes your caregiver will gradually substitute a different type of treatment or medication. Stopping may be a challenge. Regularly overusing a medication increases the potential for serious side effects. Consult a caregiver if you regularly use headache medications more than 2 days per week or more than the label advises.   ADDITIONAL QUESTIONS AND ANSWERS   Q: What is biofeedback?   A: Biofeedback is a self-help treatment. Biofeedback uses special equipment to monitor your body's involuntary physical responses. Biofeedback monitors:   Breathing.   Pulse.   Heart rate.  Temperature.   Muscle tension.   Brain activity.    Biofeedback helps you refine and perfect your relaxation exercises. You learn to control the physical responses that are related to stress. Once the technique has been mastered, you do not need the equipment any more.   Q: Are headaches hereditary?   A: Four out of five (80%) of people that suffer report a family history of migraine. Scientists are not sure if this is genetic or a family predisposition. Despite the uncertainty, a child has a 50% chance of having migraine if one parent suffers. The child has a 75% chance if both parents suffer.   Q: Can children get headaches?   A: By the time they reach high school, most young people have experienced some type of headache. Many safe and effective approaches or medications can prevent a headache from occurring or stop it after it has begun.   Q: What type of doctor should I see to diagnose and treat my headache?   A: Start with your primary caregiver. Discuss his or her experience and approach to headaches. Discuss methods of classification, diagnosis, and treatment. Your caregiver may decide to recommend you to a headache specialist,  depending upon your symptoms or other physical conditions. Having diabetes, allergies, etc., may require a more comprehensive and inclusive approach to your headache. The [employer] will provide, upon request, a list of NHF physician members in your state.   Document Released: 05/30/2003 Document Re-Released: 08/27/2009   [employer]® Patient Information ©2011 [employer].

## 2010-09-17 NOTE — Assessment & Plan Note (Signed)
Has had a few tension headaches but no pleuritic migraine since last seen. She believes that her stress is improved her headaches have become less frequent. She has needed to try her Bupap and [place] samples and finds the [place] much more helpful so she is given refills of this to use. She is reminded of the importance of lifestyle choices such as adequate sleep, good diet, good hydration and exercise.

## 2010-09-17 NOTE — Progress Notes (Signed)
Lynn Dunlap [id number] 18-Jul-1970 09/17/2010      Progress Note-Follow Up  Subjective  Chief Complaint  Chief Complaint  Patient presents with  . Follow-up    6 week follow up    HPI  Patient is in today for an appointment with improvement. She reports she is much more calm, concentrating better and her mood is improving. She finds herself less irritable and short temper with her children and is not crying easily any longer. When we first started this citalopram she notes needing one dose of Xanax during the first week. She had no difficulty with the medication and did not over sedate her and did help with her anxiety. Since that time she has not felt the need for dose. She denies any concerning side effects. She's had no GI upset no GU complaints. She denies any chest pain, palpitations, recent illness, fevers, chills. She has had some headaches more tension and migraine since her last visit. Bupap was not particularly helpful [place] Cater was. She does note a decreased intensity and frequency of the headaches as her stress level diminishes.  Past Medical History  Diagnosis Date  . Depression 08/10/2010    Past Surgical History  Procedure Date  . Cesarean section     X 1 w/ tubal    Family History  Problem Relation Age of Onset  . Cancer Mother     sarcoma, gyn cancer w/ hysterectomy  . Hypertension Father   . Asthma Brother   . Cancer Maternal Grandfather     prostate  . Hyperlipidemia Paternal Grandmother   . Glaucoma Paternal Grandfather     History   Social History  . Marital Status: Married    Spouse Name: N/A    Number of Children: N/A  . Years of Education: N/A   Occupational History  . Not on file.   Social History Main Topics  . Smoking status: Never Smoker   . Smokeless tobacco: Never Used  . Alcohol Use: No  . Drug Use: No  . Sexually Active: Yes -- Female partner(s)   Other Topics Concern  . Not on file   Social History Narrative  . No  narrative on file    Current Outpatient Prescriptions on File Prior to Visit  Medication Sig Dispense Refill  . ALPRAZolam (XANAX) 0.25 MG tablet 1/2 to 1 tab po q 12 hour prn anxiety  10 tablet  0  . promethazine (PHENERGAN) 25 MG suppository Place 25 mg rectally every 8 (eight) hours as needed. For nausea and vomitting       . DISCONTD: ACETAMINOPHEN-BUTALBITAL 50-650 MG TABS Take 1 tablet by mouth every 6 (six) hours as needed.  11 each  0  . DISCONTD: Butalbital-APAP-Caffeine 50-300-40 MG CAPS Take 1 tablet by mouth every 6 (six) hours as needed.  5 capsule  0  . DISCONTD: citalopram (CELEXA) 10 MG tablet Take 1 tablet (10 mg total) by mouth daily.  30 tablet  2    Allergies  Allergen Reactions  . Doxycycline   . Erythromycin   . Sulfonamide Derivatives     Review of Systems  Review of Systems  Constitutional: Negative for fever and malaise/fatigue.  HENT: Negative for congestion.   Eyes: Negative for discharge.  Respiratory: Negative for shortness of breath.   Cardiovascular: Negative for chest pain, palpitations and leg swelling.  Gastrointestinal: Negative for nausea, abdominal pain and diarrhea.  Genitourinary: Negative for dysuria.  Musculoskeletal: Negative for falls.  Skin: Negative for rash.  Neurological: Negative for loss of consciousness and headaches.  Endo/Heme/Allergies: Negative for polydipsia.  Psychiatric/Behavioral: Negative for depression and suicidal ideas. The patient is not nervous/anxious and does not have insomnia.     Objective  BP 132/90  Pulse 62  Temp(Src) 97.7 F (36.5 C) (Oral)  Ht 5\' 7"  (1.702 m)  Wt 146 lb (66.225 kg)  BMI 22.87 kg/m2  SpO2 95%  LMP 09/08/2010  Physical Exam  Physical Exam  Constitutional: She is oriented to person, place, and time and well-developed, well-nourished, and in no distress. No distress.  HENT:  Head: Normocephalic and atraumatic.  Eyes: Conjunctivae are normal.  Neck: Neck supple. No thyromegaly  present.  Cardiovascular: Normal rate, regular rhythm and normal heart sounds.   No murmur heard. Pulmonary/Chest: Effort normal and breath sounds normal. She has no wheezes.  Abdominal: She exhibits no distension and no mass.  Musculoskeletal: She exhibits no edema.  Lymphadenopathy:    She has no cervical adenopathy.  Neurological: She is alert and oriented to person, place, and time.  Skin: Skin is warm and dry. No rash noted. She is not diaphoretic.  Psychiatric: Memory, affect and judgment normal.    Lab Results  Component Value Date   TSH 0.90 08/07/2010   Lab Results  Component Value Date   WBC 6.9 08/07/2010   HGB 12.7 08/07/2010   HCT 37.2 08/07/2010   MCV 89.7 08/07/2010   PLT 176.0 08/07/2010   Lab Results  Component Value Date   CREATININE 0.6 08/07/2010   BUN 13 08/07/2010   NA 138 08/07/2010   K 4.4 08/07/2010   CL 106 08/07/2010   CO2 27 08/07/2010   Lab Results  Component Value Date   ALT 13 08/07/2010   AST 14 08/07/2010   ALKPHOS 61 08/07/2010   BILITOT 0.6 08/07/2010     Assessment & Plan  Depression The patient is in today feeling much better on citalopram 10 mg. She reports she is concentrating better her mood is elevating and she is much more patient with the people around her. She would like to state at the same dose at the present time. She's not had any concerning side effects. Refills are sent in for citalopram 10 mg daily and she will call she has any concerns otherwise we will followup in 3-4 months  NAUSEA WITH VOMITING Has not had any further episodes and has not needed to pick up her promethazine suppositories thus far.  MIGRAINE HEADACHE Has had a few tension headaches but no pleuritic migraine since last seen. She believes that her stress is improved her headaches have become less frequent. She has needed to try her Bupap and [place] samples and finds the [place] much more helpful so she is given refills of this to use. She is reminded of the  importance of lifestyle choices such as adequate sleep, good diet, good hydration and exercise.

## 2010-09-17 NOTE — Assessment & Plan Note (Signed)
Has not had any further episodes and has not needed to pick up her promethazine suppositories thus far.

## 2010-09-30 ENCOUNTER — Telehealth: Payer: Self-pay | Admitting: Family Medicine

## 2010-09-30 NOTE — Telephone Encounter (Signed)
Patient has "upped" her dosage of citalopram from 10 to 20, she wants to know if she should notice a difference right away or will it take a week or two?

## 2010-09-30 NOTE — Telephone Encounter (Signed)
No she will not notice a difference for 4-8 weeks again on the increased dosage. As long as she is not having any side effects on the new dosage she should just take it on faith for the next month or so then come in and see [place] to reevaluate. Can call in an rx for Citalopram 20 mg tab 1 tab po daily, #30, 1 rf

## 2010-09-30 NOTE — Telephone Encounter (Signed)
Please advise 

## 2010-10-01 MED ORDER — CITALOPRAM HYDROBROMIDE 20 MG PO TABS: 20.0000 mg | ORAL_TABLET | Freq: Every day | ORAL | Status: DC

## 2010-10-01 NOTE — Telephone Encounter (Signed)
Left a detailed message on pts cell phone and sent in new prescription

## 2010-10-24 ENCOUNTER — Ambulatory Visit: Payer: 59 | Admitting: Family Medicine

## 2010-10-24 DIAGNOSIS — Z0289 Encounter for other administrative examinations: Secondary | ICD-10-CM

## 2010-11-03 ENCOUNTER — Encounter: Payer: Self-pay | Admitting: Family Medicine

## 2010-11-03 ENCOUNTER — Ambulatory Visit (INDEPENDENT_AMBULATORY_CARE_PROVIDER_SITE_OTHER): Payer: 59 | Admitting: Family Medicine

## 2010-11-03 DIAGNOSIS — G43909 Migraine, unspecified, not intractable, without status migrainosus: Secondary | ICD-10-CM

## 2010-11-03 DIAGNOSIS — F329 Major depressive disorder, single episode, unspecified: Secondary | ICD-10-CM

## 2010-11-03 NOTE — Progress Notes (Signed)
Lynn Dunlap [id number] 07-22-1970 11/03/2010      Progress Note-Follow Up  Subjective  Chief Complaint  Chief Complaint  Patient presents with  . Follow-up    HPI  Patient is a 40 year old African American female who is in today for followup on depression and migraines. She reports in general her migraines are improved and less intense. No more nausea vomiting. She does unfortunately have several tension headaches a week. She finds that [place] is helpful but Bupap is not but she has put off getting more oriented secondary to the co-pay but does find it helpful. She's tried some over-the-counter preparations which have been active. She continues to struggle with depression. Still at 10 mg initially helped but then seemed to wear off. She was increased to 20 mg about 4 weeks ago and has not noted any difficulties but has not had seen a great improvement yet. She denies any suicidal ideation but does continues to show ongoing stress and agitation and she cares for HER-2 young children alone. No chest pain, palpitations, shortness of breath, recent illness, fevers, chills, GI or GU complaints noted today.   Past Medical History  Diagnosis Date  . Depression 08/10/2010    Past Surgical History  Procedure Date  . Cesarean section     X 1 w/ tubal    Family History  Problem Relation Age of Onset  . Cancer Mother     sarcoma, gyn cancer w/ hysterectomy  . Hypertension Father   . Asthma Brother   . Cancer Maternal Grandfather     prostate  . Hyperlipidemia Paternal Grandmother   . Glaucoma Paternal Grandfather     History   Social History  . Marital Status: Married    Spouse Name: N/A    Number of Children: N/A  . Years of Education: N/A   Occupational History  . Not on file.   Social History Main Topics  . Smoking status: Never Smoker   . Smokeless tobacco: Never Used  . Alcohol Use: No  . Drug Use: No  . Sexually Active: Yes -- Female partner(s)   Other  Topics Concern  . Not on file   Social History Narrative  . No narrative on file    Current Outpatient Prescriptions on File Prior to Visit  Medication Sig Dispense Refill  . ALPRAZolam (XANAX) 0.25 MG tablet 1/2 to 1 tab po q 12 hour prn anxiety  10 tablet  0  . Butalbital-APAP-Caffeine 50-300-40 MG CAPS Take 1 tablet by mouth every 6 (six) hours as needed.  60 capsule  1  . citalopram (CELEXA) 20 MG tablet Take 1 tablet (20 mg total) by mouth daily.  30 tablet  1  . promethazine (PHENERGAN) 25 MG suppository Place 25 mg rectally every 8 (eight) hours as needed. For nausea and vomitting         Allergies  Allergen Reactions  . Doxycycline   . Erythromycin   . Sulfonamide Derivatives     Review of Systems  Review of Systems  Constitutional: Positive for malaise/fatigue. Negative for fever.  HENT: Negative for congestion.   Eyes: Negative for discharge.  Respiratory: Negative for shortness of breath.   Cardiovascular: Negative for chest pain, palpitations and leg swelling.  Gastrointestinal: Negative for nausea, abdominal pain and diarrhea.  Genitourinary: Negative for dysuria.  Musculoskeletal: Negative for falls.  Skin: Negative for rash.  Neurological: Positive for headaches. Negative for loss of consciousness.  Endo/Heme/Allergies: Negative for polydipsia.  Psychiatric/Behavioral: Positive for depression.  Negative for suicidal ideas. The patient is not nervous/anxious and does not have insomnia.     Objective  BP 128/88  Pulse 75  Ht 5\' 7"  (1.702 m)  Wt 148 lb (67.132 kg)  BMI 23.18 kg/m2  SpO2 98%  LMP 11/01/2010  Physical Exam  Physical Exam  Constitutional: She is oriented to person, place, and time and well-developed, well-nourished, and in no distress. No distress.  HENT:  Head: Normocephalic and atraumatic.  Eyes: Conjunctivae are normal.  Neck: Neck supple. No thyromegaly present.  Cardiovascular: Normal rate, regular rhythm and normal heart sounds.    No murmur heard. Pulmonary/Chest: Effort normal and breath sounds normal. She has no wheezes.  Abdominal: She exhibits no distension and no mass.  Musculoskeletal: She exhibits no edema.  Lymphadenopathy:    She has no cervical adenopathy.  Neurological: She is alert and oriented to person, place, and time.  Skin: Skin is warm and dry. No rash noted. She is not diaphoretic.  Psychiatric: Memory, affect and judgment normal.    Lab Results  Component Value Date   TSH 0.90 08/07/2010   Lab Results  Component Value Date   WBC 6.9 08/07/2010   HGB 12.7 08/07/2010   HCT 37.2 08/07/2010   MCV 89.7 08/07/2010   PLT 176.0 08/07/2010   Lab Results  Component Value Date   CREATININE 0.6 08/07/2010   BUN 13 08/07/2010   NA 138 08/07/2010   K 4.4 08/07/2010   CL 106 08/07/2010   CO2 27 08/07/2010   Lab Results  Component Value Date   ALT 13 08/07/2010   AST 14 08/07/2010   ALKPHOS 61 08/07/2010   BILITOT 0.6 08/07/2010      Assessment & Plan  MIGRAINE HEADACHE Improved, now having more tension HA than migraines, unfortunately still several times a week. Has had a good response to [place] but not Bupap, she has an rx to fill but the co pay is hi, copay cards and samples came in after the patient left so we will contact patient and let her know she can come back to pu samples and copay cards  Depression She increased her Citalopram from 10 to 20 mg 4 weeks ago and has not noted a significant improvement yet. She will continue on this dose for another month, if no improvement but no difficulties she may then increase up to 40 mg daily by taking 2 of the 20 mg tabs daily and we will reassess at next visit

## 2010-11-03 NOTE — Assessment & Plan Note (Signed)
She increased her Citalopram from 10 to 20 mg 4 weeks ago and has not noted a significant improvement yet. She will continue on this dose for another month, if no improvement but no difficulties she may then increase up to 40 mg daily by taking 2 of the 20 mg tabs daily and we will reassess at next visit

## 2010-11-03 NOTE — Assessment & Plan Note (Signed)
Improved, now having more tension HA than migraines, unfortunately still several times a week. Has had a good response to [place] but not Bupap, she has an rx to fill but the co pay is hi, copay cards and samples came in after the patient left so we will contact patient and let her know she can come back to pu samples and copay cards

## 2010-11-03 NOTE — Patient Instructions (Signed)
Headaches, FAQs   (Frequently Asked Questions)   MIGRAINE HEADACHES   Q: What is migraine? What causes it? How can I treat it?   A: Generally, migraine headaches begin as a dull ache. Then they develop into a constant, throbbing, and pulsating pain. You may experience pain at the temples. You may experience pain at the front or back of one or both sides of the head. The pain is usually accompanied by a combination of:   Nausea.  Vomiting.  Sensitivity to light and noise.    Some people (about 15%) experience an aura (see below) before an attack. The cause of migraine is believed to be chemical reactions in the brain. Treatment for migraine may include over-the-counter or prescription medications. It may also include self-help techniques. These include relaxation training and biofeedback.   Q: What is an aura?   A: About 15% of people with migraine get an “aura”. This is a sign of neurological symptoms that occur before a migraine headache. You may see wavy or jagged lines, dots, or flashing lights. You might experience tunnel vision or blind spots in one or both eyes. The aura can include visual or auditory hallucinations (something imagined). It may include disruptions in smell (such as strange odors), taste or touch. Other symptoms include:   Numbness.   A “pins and needles” sensation.   Difficulty in recalling or speaking the correct word.   These neurological events may last as long as 60 minutes. These symptoms will fade as the headache begins.   Q: What is a trigger?   A: Certain physical or environmental factors can lead to or “trigger” a migraine. These include:   Foods.   Hormonal changes.  Weather.   Stress.    It is important to remember that triggers are different for everyone. To help prevent migraine attacks, you need to figure out which triggers affect you. Keep a headache diary. This is a good way to track triggers. The diary will help you talk to your healthcare professional about your condition.    Q: Does weather affect migraines?   A: Bright sunshine, hot, humid conditions, and drastic changes in barometric pressure may lead to, or “trigger,” a migraine attack in some people. But studies have shown that weather does not act as a trigger for everyone with migraines.   Q: What is the link between migraine and hormones?   A: Hormones start and regulate many of your body's functions. Hormones keep your body in balance within a constantly changing environment. The levels of hormones in your body are unbalanced at times. Examples are during menstruation, pregnancy, or menopause. That can lead to a migraine attack. In fact, about three quarters of all women with migraine report that their attacks are related to the menstrual cycle.   Q: Is there an increased risk of stroke for migraine sufferers?   A: The likelihood of a migraine attack causing a stroke is very remote. That is not to say that migraine sufferers cannot have a stroke associated with their migraines. In persons under age 40, the most common associated factor for stroke is migraine headache. But over the course of a person's normal life span, the occurrence of migraine headache may actually be associated with a reduced risk of dying from cerebrovascular disease due to stroke.   Q: What are acute medications for migraine?   A: Acute medications are used to treat the pain of the headache after it has started. Examples over-the-counter medications, NSAIDs, ergots,   and triptans.   Q: What are the triptans?   A: Triptans are the newest class of abortive medications. They are specifically targeted to treat migraine. Triptans are vasoconstrictors. They moderate some chemical reactions in the brain. The triptans work on receptors in your brain. Triptans help to restore the balance of a neurotransmitter called serotonin. Fluctuations in levels of serotonin are thought to be a main cause of migraine.   Q: Are over-the-counter medications for migraine  effective?   A: Over-the-counter, or “OTC,” medications may be effective in relieving mild to moderate pain and associated symptoms of migraine. But you should see your caregiver before beginning any treatment regimen for migraine.   Q: What are preventive medications for migraine?   A: Preventive medications for migraine are sometimes referred to as “prophylactic” treatments. They are used to reduce the frequency, severity, and length of migraine attacks. Examples of preventive medications include antiepileptic medications, antidepressants, beta-blockers, calcium channel blockers, and NSAIDs (nonsteroidal anti-inflammatory drugs).   Q: Why are anticonvulsants used to treat migraine?   A: During the past few years, there has been an increased interest in antiepileptic drugs for the prevention of migraine. They are sometimes referred to as “anticonvulsants”. Both epilepsy and migraine may be caused by similar reactions in the brain.   Q: Why are antidepressants used to treat migraine?   A: Antidepressants are typically used to treat people with depression. They may reduce migraine frequency by regulating chemical levels, such as serotonin, in the brain.   Q: What alternative therapies are used to treat migraine?   A: The term “alternative therapies” is often used to describe treatments considered outside the scope of conventional Western medicine. Examples of alternative therapy include acupuncture, acupressure, and yoga. Another common alternative treatment is herbal therapy. Some herbs are believed to relieve headache pain. Always discuss alternative therapies with your caregiver before proceeding. Some herbal products contain arsenic and other toxins.   TENSION HEADACHES   Q: What is a tension-type headache? What causes it? How can I treat it?   A: Tension-type headaches occur randomly. They are often the result of temporary stress, anxiety, fatigue, or anger. Symptoms include soreness in your temples, a  tightening band-like sensation around your head (a “vice-like” ache). Symptoms can also include a pulling feeling, pressure sensations, and contracting head and neck muscles. The headache begins in your forehead, temples, or the back of your head and neck. Treatment for tension-type headache may include over-the-counter or prescription medications. Treatment may also include self-help techniques such as relaxation training and biofeedback.   CLUSTER HEADACHES   Q: What is a cluster headache? What causes it? How can I treat it?   A: Cluster headache gets its name because the attacks come in groups. The pain arrives with little, if any, warning. It is usually on one side of the head. A tearing or bloodshot eye and a runny nose on the same side of the headache may also accompany the pain. Cluster headaches are believed to be caused by chemical reactions in the brain. They have been described as the most severe and intense of any headache type. Treatment for cluster headache includes prescription medication and oxygen.   SINUS HEADACHES   Q: What is a sinus headache? What causes it? How can I treat it?   A: When a cavity in the bones of the face and skull (a sinus) becomes inflamed, the inflammation will cause localized pain. This condition is usually the result of an allergic reaction,   a tumor, or an infection. If your headache is caused by a sinus blockage, such as an infection, you will probably have a fever. An x-ray will confirm a sinus blockage. Your caregiver's treatment might include antibiotics for the infection, as well as antihistamines or decongestants.   REBOUND HEADACHES   Q: What is a rebound headache? What causes it? How can I treat it?   A: A pattern of taking acute headache medications too often can lead to a condition known as “rebound headache.” A pattern of taking too much headache medication includes taking it more than 2 days per week or in excessive amounts. That means more than the label or a  caregiver advises. With rebound headaches, your medications not only stop relieving pain, they actually begin to cause headaches. Doctors treat rebound headache by tapering the medication that is being overused. Sometimes your caregiver will gradually substitute a different type of treatment or medication. Stopping may be a challenge. Regularly overusing a medication increases the potential for serious side effects. Consult a caregiver if you regularly use headache medications more than 2 days per week or more than the label advises.   ADDITIONAL QUESTIONS AND ANSWERS   Q: What is biofeedback?   A: Biofeedback is a self-help treatment. Biofeedback uses special equipment to monitor your body's involuntary physical responses. Biofeedback monitors:   Breathing.   Pulse.   Heart rate.  Temperature.   Muscle tension.   Brain activity.    Biofeedback helps you refine and perfect your relaxation exercises. You learn to control the physical responses that are related to stress. Once the technique has been mastered, you do not need the equipment any more.   Q: Are headaches hereditary?   A: Four out of five (80%) of people that suffer report a family history of migraine. Scientists are not sure if this is genetic or a family predisposition. Despite the uncertainty, a child has a 50% chance of having migraine if one parent suffers. The child has a 75% chance if both parents suffer.   Q: Can children get headaches?   A: By the time they reach high school, most young people have experienced some type of headache. Many safe and effective approaches or medications can prevent a headache from occurring or stop it after it has begun.   Q: What type of doctor should I see to diagnose and treat my headache?   A: Start with your primary caregiver. Discuss his or her experience and approach to headaches. Discuss methods of classification, diagnosis, and treatment. Your caregiver may decide to recommend you to a headache specialist,  depending upon your symptoms or other physical conditions. Having diabetes, allergies, etc., may require a more comprehensive and inclusive approach to your headache. The [employer] will provide, upon request, a list of NHF physician members in your state.   Document Released: 05/30/2003 Document Re-Released: 08/27/2009   [employer]® Patient Information ©2011 [employer].

## 2010-11-05 ENCOUNTER — Telehealth: Payer: Self-pay | Admitting: Medical

## 2010-11-05 NOTE — Telephone Encounter (Signed)
ERROR-LM 

## 2010-12-01 ENCOUNTER — Telehealth: Payer: Self-pay

## 2010-12-01 NOTE — Telephone Encounter (Signed)
Pt states she just took a drug screen for [facility] and the [place] Cater showed up in her system. The patient needs a copy of the RX or med list from MD stating pt was prescribed this medication. Fax to Dr Alto [place] at [phone]

## 2010-12-01 NOTE — Telephone Encounter (Signed)
Please print a copy of her MAR which shows that I gave her 5 samples of the Orbivan (butalbitol) and fax it for her, please check with Erie Noe and make sure we do not need to have her signa release before I can release a copy of that information

## 2010-12-02 NOTE — Telephone Encounter (Signed)
Printed and faxed. Per Erie Noe its okay to send since its MD to MD.

## 2010-12-11 ENCOUNTER — Other Ambulatory Visit: Payer: 59

## 2010-12-15 ENCOUNTER — Telehealth: Payer: Self-pay | Admitting: Family Medicine

## 2010-12-15 NOTE — Telephone Encounter (Signed)
Patient has started a new job at [employer], she would like to have her bloodwork drawn at her office, if you need to fax orders to her office their fax # is [phone]

## 2010-12-15 NOTE — Telephone Encounter (Signed)
I have not done a lipid profile on her yet, if she would like to do one order one for prior to her next visit, along with a tsh, cbc, liver, renal

## 2010-12-15 NOTE — Telephone Encounter (Signed)
Please advise 

## 2010-12-16 NOTE — Telephone Encounter (Signed)
Lynn Dunlap in lab sending order

## 2010-12-18 ENCOUNTER — Telehealth: Payer: Self-pay | Admitting: Family Medicine

## 2010-12-18 ENCOUNTER — Ambulatory Visit: Payer: 59 | Admitting: Family Medicine

## 2010-12-18 NOTE — Telephone Encounter (Signed)
Patient is requesting Celex Rx [facility] [place]

## 2010-12-22 ENCOUNTER — Ambulatory Visit: Payer: 59 | Admitting: Family Medicine

## 2010-12-26 MED ORDER — CITALOPRAM HYDROBROMIDE 20 MG PO TABS: 20.0000 mg | ORAL_TABLET | Freq: Every day | ORAL | Status: DC

## 2011-01-02 ENCOUNTER — Encounter: Payer: Self-pay | Admitting: Family Medicine

## 2011-01-02 ENCOUNTER — Ambulatory Visit (INDEPENDENT_AMBULATORY_CARE_PROVIDER_SITE_OTHER): Payer: 59 | Admitting: Family Medicine

## 2011-01-02 VITALS — BP 129/86 | HR 68 | Temp 98.1°F | Ht 67.0 in | Wt 149.4 lb

## 2011-01-02 DIAGNOSIS — M6740 Ganglion, unspecified site: Secondary | ICD-10-CM

## 2011-01-02 DIAGNOSIS — Z124 Encounter for screening for malignant neoplasm of cervix: Secondary | ICD-10-CM

## 2011-01-02 DIAGNOSIS — F329 Major depressive disorder, single episode, unspecified: Secondary | ICD-10-CM

## 2011-01-02 DIAGNOSIS — Z0000 Encounter for general adult medical examination without abnormal findings: Secondary | ICD-10-CM

## 2011-01-02 HISTORY — DX: Encounter for screening for malignant neoplasm of cervix: Z12.4

## 2011-01-02 HISTORY — DX: Encounter for general adult medical examination without abnormal findings: Z00.00

## 2011-01-02 LAB — HEPATIC FUNCTION PANEL
Albumin: 3.9 g/dL (ref 3.5–5.2)
Bilirubin, Direct: 0.1 mg/dL (ref 0.0–0.3)
Total Protein: 7.4 g/dL (ref 6.0–8.3)

## 2011-01-02 LAB — LIPID PANEL
Total CHOL/HDL Ratio: 3
Triglycerides: 34 mg/dL (ref 0.0–149.0)

## 2011-01-02 LAB — RENAL FUNCTION PANEL
Chloride: 106 mEq/L (ref 96–112)
GFR: 129.06 mL/min (ref >60.00)
Phosphorus: 3.2 mg/dL (ref 2.3–4.6)
Potassium: 4.3 mEq/L (ref 3.5–5.1)

## 2011-01-02 LAB — TSH: TSH: 0.55 u[IU]/mL (ref 0.35–5.50)

## 2011-01-02 LAB — CBC
Hemoglobin: 11.6 g/dL — ABNORMAL LOW (ref 12.0–15.0)
RBC: 3.96 MIL/uL (ref 3.87–5.11)

## 2011-01-02 MED ORDER — ALPRAZOLAM 0.25 MG PO TABS: ORAL_TABLET | ORAL | Status: DC

## 2011-01-02 MED ORDER — CITALOPRAM HYDROBROMIDE 20 MG PO TABS: 20.0000 mg | ORAL_TABLET | Freq: Every day | ORAL | Status: DC

## 2011-01-02 NOTE — Assessment & Plan Note (Signed)
Enlarging and starting to cause her discomfort most notably at work, will set up consultation with orthopaedics for possible surgical decompression. Continue ice, Aspercreme and splinting for now.

## 2011-01-02 NOTE — Progress Notes (Signed)
Lynn Dunlap [id number] Aug 06, 1970 01/02/2011      Progress Note-Follow Up  Subjective  Chief Complaint  Chief Complaint  Patient presents with  . Follow-up    3 month follow up    HPI  Patient is a 40 year old demented female who is in today for followup. She is pleased with her response to citalopram. She still struggles with significant stress  With 2 young children at home and her husband off working [place]. She has started a new job and feels she is handling all of her multiple stressors much better. She would like to maintain his same dose. She'll need a very small amount of L. to help her sleep on occasion and does have a good response. She denies any recent illness, fevers, chills, chest pain, palpitations, shortness of breath, GI or GU concerns at this time. Her right hand has become somewhat more painful. 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 The cyst over the dorsal surface of her right hand has enlarged and has become more painful. She is willing to go have it looked at this time. She has used Aspercreme infrequently and does get some marginal relief from that.  Past Medical History  Diagnosis Date  . Depression 08/10/2010  . Preventative health care 01/02/2011    Past Surgical History  Procedure Date  . Cesarean section     X 1 w/ tubal    Family History  Problem Relation Age of Onset  . Cancer Mother     sarcoma, gyn cancer w/ hysterectomy  . Hypertension Father   . Asthma Brother   . Cancer Maternal Grandfather     prostate  . Hyperlipidemia Paternal Grandmother   . Glaucoma Paternal Grandfather     History   Social History  . Marital Status: Married    Spouse Name: N/A    Number of Children: N/A  . Years of Education: N/A   Occupational History  . Not on file.   Social History Main Topics  . Smoking status: Never Smoker   .  Smokeless tobacco: Never Used  . Alcohol Use: No  . Drug Use: No  . Sexually Active: Yes -- Female partner(s)   Other Topics Concern  . Not on file   Social History Narrative  . No narrative on file    Current Outpatient Prescriptions on File Prior to Visit  Medication Sig Dispense Refill  . Butalbital-APAP-Caffeine 50-300-40 MG CAPS Take 1 tablet by mouth every 6 (six) hours as needed.  60 capsule  1  . promethazine (PHENERGAN) 25 MG suppository Place 25 mg rectally every 8 (eight) hours as needed. For nausea and vomitting         Allergies  Allergen Reactions  . Doxycycline   . Erythromycin   . Sulfonamide Derivatives     Review of Systems  Review of Systems  Constitutional: Negative for fever and malaise/fatigue.  HENT: Negative for congestion.   Eyes: Negative for discharge.  Respiratory: Negative for shortness of breath.   Cardiovascular: Negative for chest pain, palpitations and leg swelling.  Gastrointestinal: Negative for nausea, abdominal pain and diarrhea.  Genitourinary: Negative for dysuria.  Musculoskeletal: Negative for falls.  Skin: Negative for rash.  Neurological: Negative for loss of consciousness and headaches.  Endo/Heme/Allergies: Negative for polydipsia.  Psychiatric/Behavioral: Negative for depression and suicidal ideas. The patient is not nervous/anxious and does not  have insomnia.     Objective  BP 129/86  Pulse 68  Temp(Src) 98.1 F (36.7 C) (Oral)  Ht 5\' 7"  (1.702 m)  Wt 149 lb 6.4 oz (67.767 kg)  BMI 23.40 kg/m2  SpO2 100%  LMP 12/06/2010  Physical Exam  Physical Exam  Constitutional: She is oriented to person, place, and time and well-developed, well-nourished, and in no distress. No distress.  HENT:  Head: Normocephalic and atraumatic.  Eyes: Conjunctivae are normal.  Neck: Neck supple. No thyromegaly present.  Cardiovascular: Normal rate, regular rhythm and normal heart sounds.   No murmur heard. Pulmonary/Chest: Effort  normal and breath sounds normal. She has no wheezes.  Abdominal: She exhibits no distension and no mass.  Musculoskeletal: She exhibits no edema.  Lymphadenopathy:    She has no cervical adenopathy.  Neurological: She is alert and oriented to person, place, and time.  Skin: Skin is warm and dry. No rash noted. She is not diaphoretic.  Psychiatric: Memory, affect and judgment normal.    Lab Results  Component Value Date   TSH 0.55 01/02/2011   Lab Results  Component Value Date   WBC 6.9 08/07/2010   HGB 12.7 08/07/2010   HCT 37.2 08/07/2010   MCV 89.7 08/07/2010   PLT 176.0 08/07/2010   Lab Results  Component Value Date   CREATININE 0.7 01/02/2011   BUN 10 01/02/2011   NA 142 01/02/2011   K 4.3 01/02/2011   CL 106 01/02/2011   CO2 31 01/02/2011   Lab Results  Component Value Date   ALT 15 01/02/2011   AST 16 01/02/2011   ALKPHOS 75 01/02/2011   BILITOT 0.5 01/02/2011   Lab Results  Component Value Date   CHOL 165 01/02/2011   Lab Results  Component Value Date   HDL 64.40 01/02/2011   Lab Results  Component Value Date   LDLCALC 94 01/02/2011   Lab Results  Component Value Date   TRIG 34.0 01/02/2011   Lab Results  Component Value Date   CHOLHDL 3 01/02/2011     Assessment & Plan  GANGLION CYST, WRIST, RIGHT Enlarging and starting to cause her discomfort most notably at work, will set up consultation with orthopaedics for possible surgical decompression. Continue ice, Aspercreme and splinting for now.  Depression Patient doing well on current dosing of Citalopram and using Alprazolam infrequently. It is most helpful to help her sleep she is given a small amount and a refill to use intermittently. Patient will call with any concerns  Preventative health care Patient has not done fasting labs for [place] thus far. She has come in fasting to day and prepared to proceed so her lab work is ordered today. She has already taken her flu shot at work. She is encouraged  to try and maintain some exercise despite having started a new job

## 2011-01-02 NOTE — Assessment & Plan Note (Signed)
Patient has not done fasting labs for [place] thus far. She has come in fasting to day and prepared to proceed so her lab work is ordered today. She has already taken her flu shot at work. She is encouraged to try and maintain some exercise despite having started a new job

## 2011-01-02 NOTE — Patient Instructions (Signed)
Ganglion A ganglion is a swelling under the skin that is filled with a thick, jelly-like substance. It is a synovial cyst. This is caused by a break (rupture) of the joint lining from the joint space. A ganglion often occurs near an area of repeated minor trauma (damage caused by an accident). Trauma may also be a repetitive movement at work or in a sport. TREATMENT It often goes away without treatment. It may reappear later. Sometimes a ganglion may need to be surgically removed. Often they are drained and injected with a steroid. Sometimes they respond to:  Rest.   Occasionally splinting helps.  HOME CARE INSTRUCTIONS  Your caregiver will decide the best way of treating your ganglion. Do not try to break the ganglion yourself by pressing on it, poking it with a needle, or hitting it with a heavy object.   Use medications as directed.  1.  2. SEEK MEDICAL CARE IF:   The ganglion becomes larger or more painful.   You have increased redness or swelling.   You have weakness or numbness in your hand or wrist.  MAKE SURE YOU:   Understand these instructions.   Will watch your condition.   Will get help right away if you are not doing well or get worse.  Document Released: 03/06/2000 Document Re-Released: 06/05/2008 Exit[employer] Patient Information 2011 [employer]. 

## 2011-01-02 NOTE — Assessment & Plan Note (Signed)
Patient doing well on current dosing of Citalopram and using Alprazolam infrequently. It is most helpful to help her sleep she is given a small amount and a refill to use intermittently. Patient will call with any concerns

## 2011-01-04 LAB — NMR LIPOPROFILE WITHOUT LIPIDS
LDL Particle Number: 822 nmol/L (ref <1000)
LDL Size: 21.5 nm (ref >20.5)
LP-IR Score: 0 (ref <=45)
Small LDL Particle Number: <90 nmol/L (ref <=527)

## 2011-03-31 ENCOUNTER — Other Ambulatory Visit: Payer: Self-pay | Admitting: Family Medicine

## 2011-03-31 DIAGNOSIS — Z1231 Encounter for screening mammogram for malignant neoplasm of breast: Secondary | ICD-10-CM

## 2011-04-01 ENCOUNTER — Other Ambulatory Visit: Payer: Self-pay | Admitting: Medical

## 2011-04-01 ENCOUNTER — Telehealth (INDEPENDENT_AMBULATORY_CARE_PROVIDER_SITE_OTHER): Payer: 59 | Admitting: Family Medicine

## 2011-04-01 DIAGNOSIS — N390 Urinary tract infection, site not specified: Secondary | ICD-10-CM | POA: Insufficient documentation

## 2011-04-01 LAB — POCT URINALYSIS DIPSTICK
Bilirubin, UA: NEGATIVE
Glucose, UA: NEGATIVE
Nitrite, UA: POSITIVE

## 2011-04-01 MED ORDER — CIPROFLOXACIN HCL 500 MG PO TABS: 500.0000 mg | ORAL_TABLET | Freq: Two times a day (BID) | ORAL | Status: DC

## 2011-04-01 MED ORDER — CIPROFLOXACIN HCL 500 MG PO TABS: 500.0000 mg | ORAL_TABLET | Freq: Two times a day (BID) | ORAL | Status: AC

## 2011-04-03 NOTE — Telephone Encounter (Signed)
Done

## 2011-04-06 ENCOUNTER — Other Ambulatory Visit: Payer: Self-pay

## 2011-04-06 DIAGNOSIS — F329 Major depressive disorder, single episode, unspecified: Secondary | ICD-10-CM

## 2011-04-06 MED ORDER — CITALOPRAM HYDROBROMIDE 20 MG PO TABS: 20.0000 mg | ORAL_TABLET | Freq: Every day | ORAL | Status: DC

## 2011-04-30 ENCOUNTER — Ambulatory Visit (HOSPITAL_COMMUNITY)
Admission: RE | Admit: 2011-04-30 | Discharge: 2011-04-30 | Disposition: A | Discharge status: Home / Self Care | Payer: 59 | Source: Ambulatory Visit | Attending: Family Medicine | Admitting: Family Medicine

## 2011-04-30 ENCOUNTER — Other Ambulatory Visit (HOSPITAL_COMMUNITY)
Admission: RE | Admit: 2011-04-30 | Discharge: 2011-04-30 | Disposition: A | Discharge status: Home / Self Care | Payer: 59 | Source: Ambulatory Visit | Attending: Family Medicine | Admitting: Family Medicine

## 2011-04-30 ENCOUNTER — Ambulatory Visit (INDEPENDENT_AMBULATORY_CARE_PROVIDER_SITE_OTHER): Payer: 59 | Admitting: Family Medicine

## 2011-04-30 ENCOUNTER — Encounter: Payer: Self-pay | Admitting: Family Medicine

## 2011-04-30 DIAGNOSIS — N390 Urinary tract infection, site not specified: Secondary | ICD-10-CM

## 2011-04-30 DIAGNOSIS — F329 Major depressive disorder, single episode, unspecified: Secondary | ICD-10-CM

## 2011-04-30 DIAGNOSIS — G43909 Migraine, unspecified, not intractable, without status migrainosus: Secondary | ICD-10-CM

## 2011-04-30 DIAGNOSIS — F32A Depression, unspecified: Secondary | ICD-10-CM

## 2011-04-30 DIAGNOSIS — Z01419 Encounter for gynecological examination (general) (routine) without abnormal findings: Secondary | ICD-10-CM | POA: Insufficient documentation

## 2011-04-30 DIAGNOSIS — Z1231 Encounter for screening mammogram for malignant neoplasm of breast: Secondary | ICD-10-CM | POA: Insufficient documentation

## 2011-04-30 DIAGNOSIS — Z0000 Encounter for general adult medical examination without abnormal findings: Secondary | ICD-10-CM

## 2011-04-30 DIAGNOSIS — M6740 Ganglion, unspecified site: Secondary | ICD-10-CM

## 2011-04-30 DIAGNOSIS — Z124 Encounter for screening for malignant neoplasm of cervix: Secondary | ICD-10-CM

## 2011-04-30 DIAGNOSIS — N760 Acute vaginitis: Secondary | ICD-10-CM | POA: Insufficient documentation

## 2011-04-30 LAB — POCT URINALYSIS DIPSTICK
Blood, UA: NEGATIVE
Glucose, UA: NEGATIVE
Nitrite, UA: NEGATIVE
Urobilinogen, UA: 0.2

## 2011-04-30 MED ORDER — CITALOPRAM HYDROBROMIDE 20 MG PO TABS: 20.0000 mg | ORAL_TABLET | Freq: Every day | ORAL | Status: DC

## 2011-04-30 NOTE — Progress Notes (Signed)
Patient ID: Lynn Dunlap, female   DOB: [birth year], 41 y.o.   MRN: [id number] Lynn Dunlap [id number] Aug 05, 1970 04/30/2011      Progress Note-Follow Up  Subjective  Chief Complaint  Chief Complaint  Patient presents with  . Gynecologic Exam    pap smear    HPI  Patient is a 41 year old [place] American female who is in today for a Pap smear and followup on depression. She feels the Celexa is helping her most days. Her mood is stabilized and on the vast majority of day she handles her current situation well. No excessive crying or anhedonia. No suicidal ideation. Stressors no GYN complaints. She is a G2 P2 status post 1 SVD and one C-section. Has had a tubal ligation. LMP 04/07/2011 and normal. Has a once monthly . The last 3-4 days and is not excessively painful or heavy. Menarche began at age 3. No abdominal pain, back pain, chest pain, palpitations, shortness of breath, febrile illness. She did recently have a UTI which was treated by another office with ciprofloxacin and has had complete resolution of symptoms. She denies abdominal pain dysuria frequency or urgency. Denies any vaginal discharge lesions or other concerns at this time. No breast complaints and essentially scheduled to have a mammogram this afternoon. Last Pap was roughly a year and half ago possibly September 2011 per the patient.  Past Medical History  Diagnosis Date  . Depression 08/10/2010  . Preventative health care 01/02/2011    Past Surgical History  Procedure Date  . Cesarean section     X 1 w/ tubal  . Tubal ligation     Family History  Problem Relation Age of Onset  . Cancer Mother     sarcoma, gyn cancer w/ hysterectomy  . Hypertension Father   . Asthma Brother   . Cancer Maternal Grandfather     prostate  . Hyperlipidemia Paternal Grandmother   . Glaucoma Paternal Grandfather     History   Social History  . Marital Status: Married    Spouse Name: N/A    Number of Children: N/A  . Years  of Education: N/A   Occupational History  . Not on file.   Social History Main Topics  . Smoking status: Never Smoker   . Smokeless tobacco: Never Used  . Alcohol Use: No  . Drug Use: No  . Sexually Active: Yes -- Female partner(s)   Other Topics Concern  . Not on file   Social History Narrative  . No narrative on file    Current Outpatient Prescriptions on File Prior to Visit  Medication Sig Dispense Refill  . Butalbital-APAP-Caffeine 50-300-40 MG CAPS Take 1 tablet by mouth every 6 (six) hours as needed.  60 capsule  1  . promethazine (PHENERGAN) 25 MG suppository Place 25 mg rectally every 8 (eight) hours as needed. For nausea and vomitting         Allergies  Allergen Reactions  . Doxycycline   . Erythromycin   . Sulfonamide Derivatives     Review of Systems  Review of Systems  Constitutional: Negative for fever and malaise/fatigue.  HENT: Negative for congestion.   Eyes: Negative for discharge.  Respiratory: Negative for shortness of breath.   Cardiovascular: Negative for chest pain, palpitations and leg swelling.  Gastrointestinal: Negative for nausea, abdominal pain and diarrhea.  Genitourinary: Positive for dysuria.       Resolved s/p treatment with Cipro  Musculoskeletal: Negative for falls.  Skin: Negative for rash.  Neurological: Negative for loss of consciousness and headaches.  Endo/Heme/Allergies: Negative for polydipsia.  Psychiatric/Behavioral: Negative for depression and suicidal ideas. The patient is not nervous/anxious and does not have insomnia.      Objective  BP 134/85  Pulse 74  Temp(Src) 99.1 F (37.3 C) (Temporal)  Ht 5\' 7"  (1.702 m)  Wt 155 lb (70.308 kg)  BMI 24.28 kg/m2  SpO2 99%  LMP 04/07/2011  Physical Exam  Physical Exam  Constitutional: She is oriented to person, place, and time and well-developed, well-nourished, and in no distress. No distress.  HENT:  Head: Normocephalic and atraumatic.  Eyes: Conjunctivae are  normal.  Neck: Neck supple. No thyromegaly present.  Cardiovascular: Normal rate, regular rhythm and normal heart sounds.   No murmur heard. Pulmonary/Chest: Effort normal and breath sounds normal. She has no wheezes.  Abdominal: She exhibits no distension and no mass.  Genitourinary: Uterus normal, cervix normal and right adnexa normal. Vaginal discharge found.       Semi thick white discharge  Musculoskeletal: She exhibits no edema.  Lymphadenopathy:    She has no cervical adenopathy.  Neurological: She is alert and oriented to person, place, and time.  Skin: Skin is warm and dry. No rash noted. She is not diaphoretic.  Psychiatric: Memory, affect and judgment normal.    Lab Results  Component Value Date   TSH 0.55 01/02/2011   Lab Results  Component Value Date   WBC 4.8 01/02/2011   HGB 11.6* 01/02/2011   HCT 35.5* 01/02/2011   MCV 89.6 01/02/2011   PLT 227 01/02/2011   Lab Results  Component Value Date   CREATININE 0.7 01/02/2011   BUN 10 01/02/2011   NA 142 01/02/2011   K 4.3 01/02/2011   CL 106 01/02/2011   CO2 31 01/02/2011   Lab Results  Component Value Date   ALT 15 01/02/2011   AST 16 01/02/2011   ALKPHOS 75 01/02/2011   BILITOT 0.5 01/02/2011   Lab Results  Component Value Date   CHOL 165 01/02/2011   Lab Results  Component Value Date   HDL 64.40 01/02/2011   Lab Results  Component Value Date   LDLCALC 94 01/02/2011   Lab Results  Component Value Date   TRIG 34.0 01/02/2011   Lab Results  Component Value Date   CHOLHDL 3 01/02/2011     Assessment & Plan  UTI (urinary tract infection) Recent uti, asymptomatic at this time, will repeat UA to confirm clearance  Preventative health care Pap smear taken today, mild white discharge noted will check for yeast and BV and patient encouraged to add a yogurt and a probiotic daily  Depression Doing well on Citalopram, refills sent in today  GANGLION CYST, WRIST, RIGHT Has an appt with  surgery next week to have the cyst evaluated for removal. Has not enlarged or become more symptomatic since we saw her last  MIGRAINE HEADACHE Doing well, has not needed meds recently

## 2011-04-30 NOTE — Assessment & Plan Note (Signed)
Recent uti, asymptomatic at this time, will repeat UA to confirm clearance

## 2011-04-30 NOTE — Assessment & Plan Note (Signed)
Doing well on Citalopram, refills sent in today

## 2011-04-30 NOTE — Assessment & Plan Note (Signed)
Doing well, has not needed meds recently

## 2011-04-30 NOTE — Progress Notes (Signed)
Addended by: Baldemar Lenis R on: 04/30/2011 10:04 AM   Modules accepted: Orders

## 2011-04-30 NOTE — Assessment & Plan Note (Signed)
Has an appt with surgery next week to have the cyst evaluated for removal. Has not enlarged or become more symptomatic since we saw her last

## 2011-04-30 NOTE — Assessment & Plan Note (Signed)
Pap smear taken today, mild white discharge noted will check for yeast and BV and patient encouraged to add a yogurt and a probiotic daily

## 2011-04-30 NOTE — Patient Instructions (Signed)

## 2011-05-04 LAB — URINE CULTURE

## 2011-05-05 ENCOUNTER — Encounter: Payer: Self-pay | Admitting: Family Medicine

## 2011-05-05 ENCOUNTER — Ambulatory Visit (INDEPENDENT_AMBULATORY_CARE_PROVIDER_SITE_OTHER): Payer: 59 | Admitting: Family Medicine

## 2011-05-05 VITALS — BP 126/84 | HR 79 | Temp 99.8°F | Ht 67.0 in | Wt 156.1 lb

## 2011-05-05 DIAGNOSIS — H109 Unspecified conjunctivitis: Secondary | ICD-10-CM | POA: Insufficient documentation

## 2011-05-05 DIAGNOSIS — N390 Urinary tract infection, site not specified: Secondary | ICD-10-CM

## 2011-05-05 MED ORDER — BACITRACIN-POLYMYXIN B 500-10000 UNIT/GM OP OINT: TOPICAL_OINTMENT | Freq: Two times a day (BID) | OPHTHALMIC | Status: AC

## 2011-05-05 MED ORDER — CIPROFLOXACIN HCL 500 MG PO TABS: 500.0000 mg | ORAL_TABLET | Freq: Two times a day (BID) | ORAL | Status: DC

## 2011-05-05 NOTE — Progress Notes (Signed)
Addended by: Court Joy on: 05/05/2011 08:15 AM   Modules accepted: Orders

## 2011-05-05 NOTE — Progress Notes (Signed)
Lynn Dunlap ID: Lynn Dunlap, female   DOB: 1970/10/02, 41 y.o.   MRN: [id number] [place] Droll Gains [id number] Mar 27, 1970 05/05/2011      Progress Note-Follow Up  Subjective  Chief Complaint  Chief Complaint  Lynn Dunlap presents with  . allergic reaction    pt was diagnosed with pink eye yesterday- allergic reaction to eye drops?    HPI  Lynn Dunlap is a 41 year old American female who is in today with a 24-hour history of worsening itching, irritation, redness and swelling in her right eye. She has some ciprofloxacin drops she tried those but so far has not helped. No visual changes with some mild discomfort is noted. No symptoms in the left eye. No signs of trauma or foreign body in the eye. No significant congestion or URI symptoms. No urinary symptoms, frequency, urgency, dysuria no abdominal pain, GI symptoms, chest pain, palpitations, shortness of breath.  Past Medical History  Diagnosis Date  . Depression 08/10/2010  . Preventative health care 01/02/2011  . Conjunctivitis of right eye 05/05/2011    Past Surgical History  Procedure Date  . Cesarean section     X 1 w/ tubal  . Tubal ligation     Family History  Problem Relation Age of Onset  . Cancer Mother     sarcoma, gyn cancer w/ hysterectomy  . Hypertension Father   . Asthma Brother   . Cancer Maternal Grandfather     prostate  . Hyperlipidemia Paternal Grandmother   . Glaucoma Paternal Grandfather     History   Social History  . Marital Status: Married    Spouse Name: N/A    Number of Children: N/A  . Years of Education: N/A   Occupational History  . Not on file.   Social History Main Topics  . Smoking status: Never Smoker   . Smokeless tobacco: Never Used  . Alcohol Use: No  . Drug Use: No  . Sexually Active: Yes -- Female partner(s)   Other Topics Concern  . Not on file   Social History Narrative  . No narrative on file    Current Outpatient Prescriptions on File Prior to Visit  Medication Sig  Dispense Refill  . Butalbital-APAP-Caffeine 50-300-40 MG CAPS Take 1 tablet by mouth every 6 (six) hours as needed.  60 capsule  1  . citalopram (CELEXA) 20 MG tablet Take 1 tablet (20 mg total) by mouth daily.  30 tablet  6  . promethazine (PHENERGAN) 25 MG suppository Place 25 mg rectally every 8 (eight) hours as needed. For nausea and vomitting         Allergies  Allergen Reactions  . Doxycycline   . Erythromycin   . Sulfonamide Derivatives     Review of Systems  Review of Systems  Constitutional: Negative for fever and malaise/fatigue.  HENT: Negative for congestion.   Eyes: Positive for discharge and redness. Negative for blurred vision, double vision and photophobia.       Right eye symptoms started yesterday, with redness, itching, gray discharge and swelling of lids, no symptoms in left eye. Tried some Cipro drops without any benefit.   Respiratory: Negative for shortness of breath.   Cardiovascular: Negative for chest pain, palpitations and leg swelling.  Gastrointestinal: Negative for nausea, abdominal pain and diarrhea.  Genitourinary: Negative for dysuria.  Musculoskeletal: Negative for falls.  Skin: Negative for rash.  Neurological: Negative for loss of consciousness and headaches.  Endo/Heme/Allergies: Negative for polydipsia.  Psychiatric/Behavioral: Negative for depression and suicidal ideas. The  Lynn Dunlap is not nervous/anxious and does not have insomnia.     Objective  BP 126/84  Pulse 79  Temp(Src) 99.8 F (37.7 C) (Temporal)  Ht 5\' 7"  (1.702 m)  Wt 156 lb 1.9 oz (70.816 kg)  BMI 24.45 kg/m2  SpO2 98%  LMP 04/07/2011  Physical Exam  Physical Exam  Constitutional: She is oriented to person, place, and time and well-developed, well-nourished, and in no distress. No distress.  HENT:  Head: Normocephalic and atraumatic.  Eyes: EOM are normal. Pupils are equal, round, and reactive to light. Right eye exhibits discharge. Left eye exhibits no discharge. No  scleral icterus.       Sclerae and conjunctivae erythematous in right eye. Upper and lower lids, left eye normal  Neck: Neck supple. No thyromegaly present.  Cardiovascular: Normal rate, regular rhythm and normal heart sounds.   No murmur heard. Pulmonary/Chest: Effort normal and breath sounds normal. She has no wheezes.  Abdominal: She exhibits no distension and no mass.  Musculoskeletal: She exhibits no edema.  Lymphadenopathy:    She has no cervical adenopathy.  Neurological: She is alert and oriented to person, place, and time.  Skin: Skin is warm and dry. No rash noted. She is not diaphoretic.  Psychiatric: Memory, affect and judgment normal.    Lab Results  Component Value Date   TSH 0.55 01/02/2011   Lab Results  Component Value Date   WBC 4.8 01/02/2011   HGB 11.6* 01/02/2011   HCT 35.5* 01/02/2011   MCV 89.6 01/02/2011   PLT 227 01/02/2011   Lab Results  Component Value Date   CREATININE 0.7 01/02/2011   BUN 10 01/02/2011   NA 142 01/02/2011   K 4.3 01/02/2011   CL 106 01/02/2011   CO2 31 01/02/2011   Lab Results  Component Value Date   ALT 15 01/02/2011   AST 16 01/02/2011   ALKPHOS 75 01/02/2011   BILITOT 0.5 01/02/2011   Lab Results  Component Value Date   CHOL 165 01/02/2011   Lab Results  Component Value Date   HDL 64.40 01/02/2011   Lab Results  Component Value Date   LDLCALC 94 01/02/2011   Lab Results  Component Value Date   TRIG 34.0 01/02/2011   Lab Results  Component Value Date   CHOLHDL 3 01/02/2011     Assessment & Plan  UTI (urinary tract infection) Urine culture was positive, Lynn Dunlap agrees to start Ciprofloxacin 500mg  bid, increase clear fluid intake  Conjunctivitis of right eye Warm compresses tid and prn, polysporin twice daily if symptoms persist.

## 2011-05-05 NOTE — Assessment & Plan Note (Signed)
Urine culture was positive, patient agrees to start Ciprofloxacin 500mg  bid, increase clear fluid intake

## 2011-05-05 NOTE — Assessment & Plan Note (Signed)
Warm compresses tid and prn, polysporin twice daily if symptoms persist.

## 2011-05-11 ENCOUNTER — Other Ambulatory Visit: Payer: Self-pay | Admitting: Orthopedic Surgery

## 2011-05-11 MED ORDER — METRONIDAZOLE 500 MG PO TABS: 500.0000 mg | ORAL_TABLET | Freq: Two times a day (BID) | ORAL | Status: AC

## 2011-05-11 NOTE — Progress Notes (Signed)
Addended by: Luisa Dago on: 05/11/2011 05:19 PM   Modules accepted: Orders

## 2011-06-08 ENCOUNTER — Encounter (HOSPITAL_BASED_OUTPATIENT_CLINIC_OR_DEPARTMENT_OTHER): Payer: Self-pay | Admitting: *Deleted

## 2011-06-10 NOTE — Pre-Procedure Instructions (Signed)
CALLED PT TO FINISH HISTORY AND SHE STATES THAT THE GANGLION IS BETTER. TOLD HER TO CALL DR Caryl Bis OFFICE TO CANCEL HER SURGERY

## 2011-06-12 ENCOUNTER — Ambulatory Visit (HOSPITAL_BASED_OUTPATIENT_CLINIC_OR_DEPARTMENT_OTHER): Admission: RE | Admit: 2011-06-12 | Payer: 59 | Source: Ambulatory Visit | Admitting: Orthopedic Surgery

## 2011-06-12 ENCOUNTER — Encounter (HOSPITAL_BASED_OUTPATIENT_CLINIC_OR_DEPARTMENT_OTHER): Admission: RE | Payer: Self-pay | Source: Ambulatory Visit

## 2011-06-12 HISTORY — DX: Headache: R51

## 2011-06-12 HISTORY — DX: Anxiety disorder, unspecified: F41.9

## 2011-06-12 SURGERY — EXCISION MASS
Anesthesia: General | Laterality: Right

## 2011-07-02 ENCOUNTER — Ambulatory Visit: Payer: 59 | Admitting: Family Medicine

## 2011-09-15 ENCOUNTER — Encounter: Payer: Self-pay | Admitting: Medical

## 2011-09-15 ENCOUNTER — Ambulatory Visit (INDEPENDENT_AMBULATORY_CARE_PROVIDER_SITE_OTHER): Payer: 59 | Admitting: Medical

## 2011-09-15 VITALS — BP 135/77 | HR 60 | Temp 97.5°F | Resp 16

## 2011-09-15 DIAGNOSIS — R51 Headache: Secondary | ICD-10-CM

## 2011-09-15 DIAGNOSIS — G43909 Migraine, unspecified, not intractable, without status migrainosus: Secondary | ICD-10-CM

## 2011-09-15 DIAGNOSIS — R112 Nausea with vomiting, unspecified: Secondary | ICD-10-CM

## 2011-09-15 MED ORDER — KETOROLAC TROMETHAMINE 60 MG/2ML IM SOLN
60.0000 mg | Freq: Once | INTRAMUSCULAR | Status: AC
  Administered 2011-09-15: 60 mg via INTRAMUSCULAR

## 2011-09-15 MED ORDER — PROMETHAZINE HCL 25 MG/ML IJ SOLN
12.5000 mg | Freq: Once | INTRAMUSCULAR | Status: AC
  Administered 2011-09-15: 12.5 mg via INTRAMUSCULAR

## 2011-09-16 ENCOUNTER — Encounter: Payer: Self-pay | Admitting: Medical

## 2011-09-16 NOTE — Progress Notes (Signed)
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  She has long hx/o migraines, including atypical symptoms such as visual disturbance.  She notes 4 days hx/o bad migraine, not resolving despite several round of Excedrin migraine OTC. Started on right temporal region and has now progressed to be headache all over, and quite a bit of nausea, some vomiting.  She notes left visual floaters and blurriness.  These are all typical of her prior migraines.  She is here for nurse visit for medication to break the migraine.  Objective: Gen: in pain, wd, wn Neuro: nonfocal  Assessment: Encounter Diagnoses  Name Primary?  . Migraine Yes  . Headache   . Nausea & vomiting    Plan: Discussed options.  IM 60mg  Toradol and 12.5mg  Phenergan IM given by nurse Cheri today in office.  Discussed supportive care, and to let me know if not improving.

## 2011-09-24 ENCOUNTER — Other Ambulatory Visit: Payer: Self-pay | Admitting: Family Medicine

## 2011-09-25 NOTE — Telephone Encounter (Signed)
eScribe request for refill on orbivan Last filled on 09/17/10, #[phone] x 1 Follow up is scheduled for 10/29/11 Please advise refills.

## 2011-10-29 ENCOUNTER — Ambulatory Visit: Payer: Self-pay | Admitting: Family Medicine

## 2011-11-12 ENCOUNTER — Encounter: Payer: Self-pay | Admitting: Family Medicine

## 2011-11-12 ENCOUNTER — Ambulatory Visit (INDEPENDENT_AMBULATORY_CARE_PROVIDER_SITE_OTHER): Payer: 59 | Admitting: Family Medicine

## 2011-11-12 VITALS — BP 132/88 | HR 72 | Temp 97.4°F | Ht 67.0 in | Wt 159.4 lb

## 2011-11-12 DIAGNOSIS — F419 Anxiety disorder, unspecified: Secondary | ICD-10-CM

## 2011-11-12 DIAGNOSIS — G43909 Migraine, unspecified, not intractable, without status migrainosus: Secondary | ICD-10-CM

## 2011-11-12 DIAGNOSIS — N390 Urinary tract infection, site not specified: Secondary | ICD-10-CM

## 2011-11-12 DIAGNOSIS — F329 Major depressive disorder, single episode, unspecified: Secondary | ICD-10-CM

## 2011-11-12 DIAGNOSIS — F341 Dysthymic disorder: Secondary | ICD-10-CM

## 2011-11-12 MED ORDER — CITALOPRAM HYDROBROMIDE 40 MG PO TABS: 40.0000 mg | ORAL_TABLET | Freq: Every day | ORAL | Status: DC

## 2011-11-12 MED ORDER — BUTALBITAL-APAP-CAFFEINE 50-325-40 MG PO TABS: 1.0000 | ORAL_TABLET | Freq: Four times a day (QID) | ORAL | Status: DC | PRN

## 2011-11-12 NOTE — Patient Instructions (Addendum)

## 2011-11-15 NOTE — Assessment & Plan Note (Signed)
Carlisle Cater helps when needed but too expensive will change to a generic preparation

## 2011-11-15 NOTE — Assessment & Plan Note (Signed)
resolved 

## 2011-11-15 NOTE — Assessment & Plan Note (Signed)
Had been intermittently taking Citalopram at 40 mg prn, will increase to 40 mg daily and reassess at next visit or sooner as needed

## 2011-11-15 NOTE — Progress Notes (Signed)
Patient ID: Lynn Dunlap, female   DOB: [birth year], 41 y.o.   MRN: [id number] Nyesha Cliff Malkin [id number] [birth year] 11/15/2011      Progress Note-Follow Up  Subjective  Chief Complaint  Chief Complaint  Patient presents with  . Follow-up    6 month    HPI  Patient is a 41 year old [place] American female who is in today for followup. She is continuing to struggle with high stress as her husband continues to travel and she has 2 young children at home. She's tried to take citalopram 40 does inconsistently has not had any trouble acknowledges her anxiety and depression worsened she probably should increase. She has intermittent headaches which respond to orbit and it is too expensive. No other recent illness, fevers, chills, chest pain, palpitations, shortness of breath, GI or GU complaints.  Past Medical History  Diagnosis Date  . Depression 08/10/2010  . Preventative health care 01/02/2011  . Headache   . Anxiety     Past Surgical History  Procedure Date  . Cesarean section     X 1 w/ tubal  . Tubal ligation     Family History  Problem Relation Age of Onset  . Cancer Mother     sarcoma, gyn cancer w/ hysterectomy  . Hypertension Father   . Asthma Brother   . Cancer Maternal Grandfather     prostate  . Hyperlipidemia Paternal Grandmother   . Glaucoma Paternal Grandfather     History   Social History  . Marital Status: Married    Spouse Name: N/A    Number of Children: N/A  . Years of Education: N/A   Occupational History  . Not on file.   Social History Main Topics  . Smoking status: Never Smoker   . Smokeless tobacco: Never Used  . Alcohol Use: No  . Drug Use: No  . Sexually Active: Yes -- Female partner(s)   Other Topics Concern  . Not on file   Social History Narrative  . No narrative on file    Current Outpatient Prescriptions on File Prior to Visit  Medication Sig Dispense Refill  . ORBIVAN 50-300-40 MG CAPS TAKE ONE CAPSULE BY MOUTH EVERY 6  HOURS AS NEEDED  60 capsule  1  . promethazine (PHENERGAN) 25 MG suppository Place 25 mg rectally every 8 (eight) hours as needed. For nausea and vomitting         Allergies  Allergen Reactions  . Doxycycline   . Erythromycin   . Sulfonamide Derivatives     Review of Systems  Review of Systems  Constitutional: Negative for fever and malaise/fatigue.  HENT: Negative for congestion.   Eyes: Negative for discharge.  Respiratory: Negative for shortness of breath.   Cardiovascular: Negative for chest pain, palpitations and leg swelling.  Gastrointestinal: Negative for nausea, abdominal pain and diarrhea.  Genitourinary: Negative for dysuria.  Musculoskeletal: Negative for falls.  Skin: Negative for rash.  Neurological: Negative for loss of consciousness and headaches.  Endo/Heme/Allergies: Negative for polydipsia.  Psychiatric/Behavioral: Negative for depression and suicidal ideas. The patient is nervous/anxious and has insomnia.     Objective  BP 136/90  Pulse 72  Temp 97.4 F (36.3 C) (Temporal)  Ht 5\' 7"  (1.702 m)  Wt 159 lb 6.4 oz (72.303 kg)  BMI 24.97 kg/m2  SpO2 100%  LMP 10/22/2011  Physical Exam  Physical Exam  Constitutional: She is oriented to person, place, and time and well-developed, well-nourished, and in no distress. No distress.  HENT:  Head: Normocephalic and atraumatic.  Eyes: Conjunctivae are normal.  Neck: Neck supple. No thyromegaly present.  Cardiovascular: Normal rate, regular rhythm and normal heart sounds.   No murmur heard. Pulmonary/Chest: Effort normal and breath sounds normal. She has no wheezes.  Abdominal: She exhibits no distension and no mass.  Musculoskeletal: She exhibits no edema.  Lymphadenopathy:    She has no cervical adenopathy.  Neurological: She is alert and oriented to person, place, and time.  Skin: Skin is warm and dry. No rash noted. She is not diaphoretic.  Psychiatric: Memory, affect and judgment normal.    Lab  Results  Component Value Date   TSH 0.55 01/02/2011   Lab Results  Component Value Date   WBC 4.8 01/02/2011   HGB 11.6* 01/02/2011   HCT 35.5* 01/02/2011   MCV 89.6 01/02/2011   PLT 227 01/02/2011   Lab Results  Component Value Date   CREATININE 0.7 01/02/2011   BUN 10 01/02/2011   NA 142 01/02/2011   K 4.3 01/02/2011   CL 106 01/02/2011   CO2 31 01/02/2011   Lab Results  Component Value Date   ALT 15 01/02/2011   AST 16 01/02/2011   ALKPHOS 75 01/02/2011   BILITOT 0.5 01/02/2011   Lab Results  Component Value Date   CHOL 165 01/02/2011   Lab Results  Component Value Date   HDL 64.40 01/02/2011   Lab Results  Component Value Date   LDLCALC 94 01/02/2011   Lab Results  Component Value Date   TRIG 34.0 01/02/2011   Lab Results  Component Value Date   CHOLHDL 3 01/02/2011     Assessment & Plan  UTI (urinary tract infection) resolved  MIGRAINE HEADACHE orbivan helps when needed but too expensive will change to a generic preparation  Depression Had been intermittently taking Citalopram at 40 mg prn, will increase to 40 mg daily and reassess at next visit or sooner as needed

## 2012-01-07 ENCOUNTER — Other Ambulatory Visit (INDEPENDENT_AMBULATORY_CARE_PROVIDER_SITE_OTHER): Payer: 59

## 2012-01-07 DIAGNOSIS — Z0000 Encounter for general adult medical examination without abnormal findings: Secondary | ICD-10-CM

## 2012-01-07 DIAGNOSIS — D649 Anemia, unspecified: Secondary | ICD-10-CM

## 2012-01-07 LAB — HEPATIC FUNCTION PANEL
ALT: 15 U/L (ref 0–35)
AST: 16 U/L (ref 0–37)
Alkaline Phosphatase: 68 U/L (ref 39–117)
Bilirubin, Direct: 0.1 mg/dL (ref 0.0–0.3)
Total Protein: 7.6 g/dL (ref 6.0–8.3)

## 2012-01-07 LAB — RENAL FUNCTION PANEL
CO2: 27 mEq/L (ref 19–32)
Chloride: 106 mEq/L (ref 96–112)
Creatinine, Ser: 0.7 mg/dL (ref 0.4–1.2)
GFR: 112.33 mL/min (ref >60.00)
Potassium: 4 mEq/L (ref 3.5–5.1)
Sodium: 139 mEq/L (ref 135–145)

## 2012-01-07 LAB — LIPID PANEL
HDL: 53.6 mg/dL (ref >39.00)
Total CHOL/HDL Ratio: 3

## 2012-01-07 LAB — TSH: TSH: 0.97 u[IU]/mL (ref 0.35–5.50)

## 2012-01-07 LAB — CBC
MCHC: 32.3 g/dL (ref 30.0–36.0)
Platelets: 183 10*3/uL (ref 150.0–400.0)

## 2012-01-14 ENCOUNTER — Encounter: Payer: Self-pay | Admitting: Family Medicine

## 2012-01-14 ENCOUNTER — Ambulatory Visit (INDEPENDENT_AMBULATORY_CARE_PROVIDER_SITE_OTHER): Payer: 59 | Admitting: Family Medicine

## 2012-01-14 VITALS — BP 139/88 | HR 72 | Temp 98.2°F | Ht 67.0 in | Wt 160.4 lb

## 2012-01-14 DIAGNOSIS — D649 Anemia, unspecified: Secondary | ICD-10-CM

## 2012-01-14 DIAGNOSIS — D709 Neutropenia, unspecified: Secondary | ICD-10-CM

## 2012-01-14 DIAGNOSIS — G43909 Migraine, unspecified, not intractable, without status migrainosus: Secondary | ICD-10-CM

## 2012-01-14 DIAGNOSIS — F329 Major depressive disorder, single episode, unspecified: Secondary | ICD-10-CM

## 2012-01-14 MED ORDER — ESCITALOPRAM OXALATE 20 MG PO TABS: 20.0000 mg | ORAL_TABLET | Freq: Every day | ORAL | Status: DC

## 2012-01-14 NOTE — Patient Instructions (Addendum)

## 2012-01-17 ENCOUNTER — Encounter: Payer: Self-pay | Admitting: Family Medicine

## 2012-01-17 DIAGNOSIS — D709 Neutropenia, unspecified: Secondary | ICD-10-CM

## 2012-01-17 DIAGNOSIS — D649 Anemia, unspecified: Secondary | ICD-10-CM

## 2012-01-17 HISTORY — DX: Neutropenia, unspecified: D70.9

## 2012-01-17 HISTORY — DX: Anemia, unspecified: D64.9

## 2012-01-17 NOTE — Assessment & Plan Note (Signed)
Infrequent at this time responds to [facility]

## 2012-01-17 NOTE — Progress Notes (Signed)
Patient ID: Lynn Dunlap, female   DOB: May 27, 1970, 41 y.o.   MRN: [id number] Lynn Dunlap [id number] 06-May-1970 01/17/2012      Progress Note-Follow Up  Subjective  Chief Complaint  Chief Complaint  Patient presents with  . Follow-up    2 month    HPI  Patient is a 41 year old [place] American female who is in today for followup. She had her flu shot on September 30. She reports her physical health has been good but she doesn't know which only partial response to citalopram. She still has low mood and frustration or frequent basis. No suicidal ideation. Struggles with anhedonia and fatigue. Has had infrequent headaches but does have a good response to butalbital when she needs it. No complaints of chest pain, palpitations, shortness of breath, GI or GU concerns today  Past Medical History  Diagnosis Date  . Depression 08/10/2010  . Preventative health care 01/02/2011  . Headache   . Anxiety   . Anemia 01/17/2012  . Neutropenia 01/17/2012    Past Surgical History  Procedure Date  . Cesarean section     X 1 w/ tubal  . Tubal ligation     Family History  Problem Relation Age of Onset  . Cancer Mother     sarcoma, gyn cancer w/ hysterectomy  . Hypertension Father   . Asthma Brother   . Cancer Maternal Grandfather     prostate  . Hyperlipidemia Paternal Grandmother   . Glaucoma Paternal Grandfather     History   Social History  . Marital Status: Married    Spouse Name: N/A    Number of Children: N/A  . Years of Education: N/A   Occupational History  . Not on file.   Social History Main Topics  . Smoking status: Never Smoker   . Smokeless tobacco: Never Used  . Alcohol Use: No  . Drug Use: No  . Sexually Active: Yes -- Female partner(s)   Other Topics Concern  . Not on file   Social History Narrative  . No narrative on file    Current Outpatient Prescriptions on File Prior to Visit  Medication Sig Dispense Refill  .  butalbital-acetaminophen-caffeine (FIORICET) 50-325-40 MG per tablet Take 1 tablet by mouth every 6 (six) hours as needed for pain or headache.  60 tablet  1  . promethazine (PHENERGAN) 25 MG suppository Place 25 mg rectally every 8 (eight) hours as needed. For nausea and vomitting       . escitalopram (LEXAPRO) 20 MG tablet Take 1 tablet (20 mg total) by mouth daily.  30 tablet  3    Allergies  Allergen Reactions  . Doxycycline   . Erythromycin   . Sulfonamide Derivatives     Review of Systems  Review of Systems  Constitutional: Positive for malaise/fatigue. Negative for fever.  HENT: Negative for congestion.   Eyes: Negative for discharge.  Respiratory: Negative for shortness of breath.   Cardiovascular: Negative for chest pain, palpitations and leg swelling.  Gastrointestinal: Negative for nausea, abdominal pain and diarrhea.  Genitourinary: Negative for dysuria.  Musculoskeletal: Negative for falls.  Skin: Negative for rash.  Neurological: Negative for loss of consciousness and headaches.  Endo/Heme/Allergies: Negative for polydipsia.  Psychiatric/Behavioral: Positive for depression. Negative for suicidal ideas. The patient is nervous/anxious. The patient does not have insomnia.     Objective  BP 139/88  Pulse 72  Temp 98.2 F (36.8 C) (Temporal)  Ht 5\' 7"  (1.702 m)  Wt 160 lb  6.4 oz (72.757 kg)  BMI 25.12 kg/m2  SpO2 98%  LMP 12/22/2011  Physical Exam  Physical Exam  Constitutional: She is oriented to person, place, and time and well-developed, well-nourished, and in no distress. No distress.  HENT:  Head: Normocephalic and atraumatic.  Eyes: Conjunctivae normal are normal.  Neck: Neck supple. No thyromegaly present.  Cardiovascular: Normal rate, regular rhythm and normal heart sounds.   No murmur heard. Pulmonary/Chest: Effort normal and breath sounds normal. She has no wheezes.  Abdominal: She exhibits no distension and no mass.  Musculoskeletal: She  exhibits no edema.  Lymphadenopathy:    She has no cervical adenopathy.  Neurological: She is alert and oriented to person, place, and time.  Skin: Skin is warm and dry. No rash noted. She is not diaphoretic.  Psychiatric: Memory, affect and judgment normal.    Lab Results  Component Value Date   TSH 0.97 01/07/2012   Lab Results  Component Value Date   WBC 4.4* 01/07/2012   HGB 11.6* 01/07/2012   HCT 35.8* 01/07/2012   MCV 89.9 01/07/2012   PLT 183.0 01/07/2012   Lab Results  Component Value Date   CREATININE 0.7 01/07/2012   BUN 6 01/07/2012   NA 139 01/07/2012   K 4.0 01/07/2012   CL 106 01/07/2012   CO2 27 01/07/2012   Lab Results  Component Value Date   ALT 15 01/07/2012   AST 16 01/07/2012   ALKPHOS 68 01/07/2012   BILITOT 0.5 01/07/2012   Lab Results  Component Value Date   CHOL 156 01/07/2012   Lab Results  Component Value Date   HDL 53.60 01/07/2012   Lab Results  Component Value Date   LDLCALC 94 01/07/2012   Lab Results  Component Value Date   TRIG 40.0 01/07/2012   Lab Results  Component Value Date   CHOLHDL 3 01/07/2012     Assessment & Plan  Neutropenia Mild, will recheck with next visit.   MIGRAINE HEADACHE Infrequent at this time responds to Butalbital  Anemia Mild encouraged leafy greens and red meat  Depression Only partial response to Citalopram will try switching to Lexapro and reassess at next visit.

## 2012-01-17 NOTE — Assessment & Plan Note (Signed)
Mild, will recheck with next visit 

## 2012-01-17 NOTE — Assessment & Plan Note (Signed)
Mild encouraged leafy greens and red meat

## 2012-01-17 NOTE — Assessment & Plan Note (Signed)
Only partial response to Citalopram will try switching to Lexapro and reassess at next visit.

## 2012-04-14 ENCOUNTER — Ambulatory Visit: Payer: 59 | Admitting: Family Medicine

## 2012-04-25 ENCOUNTER — Other Ambulatory Visit: Payer: Self-pay | Admitting: *Deleted

## 2012-04-25 MED ORDER — ACYCLOVIR 400 MG PO TABS: 400.0000 mg | ORAL_TABLET | Freq: Three times a day (TID) | ORAL | Status: DC

## 2012-04-25 NOTE — Telephone Encounter (Signed)
Please advise 

## 2012-04-25 NOTE — Telephone Encounter (Signed)
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

## 2012-04-25 NOTE — Telephone Encounter (Signed)
Acyclovir 400 mg po tid x 7-10 days, disp #30, 1 rf

## 2012-04-28 ENCOUNTER — Ambulatory Visit: Payer: 59 | Admitting: Family Medicine

## 2012-05-24 ENCOUNTER — Other Ambulatory Visit: Payer: Self-pay | Admitting: Family Medicine

## 2012-05-31 ENCOUNTER — Other Ambulatory Visit: Payer: Self-pay | Admitting: Medical

## 2012-05-31 MED ORDER — PROMETHAZINE HCL 25 MG RE SUPP: 25.0000 mg | Freq: Four times a day (QID) | RECTAL | Status: DC | PRN

## 2012-06-13 ENCOUNTER — Other Ambulatory Visit: Payer: Self-pay | Admitting: Family Medicine

## 2012-06-13 DIAGNOSIS — Z1231 Encounter for screening mammogram for malignant neoplasm of breast: Secondary | ICD-10-CM

## 2012-06-23 ENCOUNTER — Ambulatory Visit (HOSPITAL_COMMUNITY)
Admission: RE | Admit: 2012-06-23 | Discharge: 2012-06-23 | Disposition: A | Discharge status: Home / Self Care | Payer: 59 | Source: Ambulatory Visit | Attending: Family Medicine | Admitting: Family Medicine

## 2012-06-23 DIAGNOSIS — Z1231 Encounter for screening mammogram for malignant neoplasm of breast: Secondary | ICD-10-CM | POA: Insufficient documentation

## 2012-07-19 ENCOUNTER — Telehealth: Payer: Self-pay | Admitting: Family Medicine

## 2012-07-19 DIAGNOSIS — Z0000 Encounter for general adult medical examination without abnormal findings: Secondary | ICD-10-CM

## 2012-07-19 DIAGNOSIS — D649 Anemia, unspecified: Secondary | ICD-10-CM

## 2012-07-19 NOTE — Telephone Encounter (Signed)
Patient has cpe/pap scheduled for 09/01/12. She would like to do her labs prior at her work. She works at [employer]. The fax# is [phone]. Please fax orders. Thanks!

## 2012-07-19 NOTE — Telephone Encounter (Signed)
So notify did lipids in October in 2013 which were normal then so would not repeat now. Needs cbc, tsh, liver, renal for anemia and preventative exam

## 2012-07-19 NOTE — Telephone Encounter (Signed)
Please advise which labs need to be ordered and diagnosis codes.

## 2012-07-21 NOTE — Telephone Encounter (Signed)
Orders faxed

## 2012-07-25 ENCOUNTER — Telehealth: Payer: Self-pay | Admitting: Family Medicine

## 2012-07-25 ENCOUNTER — Other Ambulatory Visit: Payer: 59

## 2012-07-25 DIAGNOSIS — D649 Anemia, unspecified: Secondary | ICD-10-CM

## 2012-07-25 DIAGNOSIS — Z0000 Encounter for general adult medical examination without abnormal findings: Secondary | ICD-10-CM

## 2012-07-25 LAB — CBC WITH DIFFERENTIAL/PLATELET
HCT: 36.5 % (ref 36.0–46.0)
Hemoglobin: 12.2 g/dL (ref 12.0–15.0)
Lymphocytes Relative: 35 % (ref 12–46)
MCHC: 33.4 g/dL (ref 30.0–36.0)
Monocytes Absolute: 0.3 10*3/uL (ref 0.1–1.0)
Monocytes Relative: 5 % (ref 3–12)
Neutro Abs: 2.8 10*3/uL (ref 1.7–7.7)
WBC: 5.1 10*3/uL (ref 4.0–10.5)

## 2012-07-25 LAB — RENAL FUNCTION PANEL
BUN: 8 mg/dL (ref 6–23)
CO2: 25 mEq/L (ref 19–32)
Chloride: 104 mEq/L (ref 96–112)
Glucose, Bld: 71 mg/dL (ref 70–99)
Potassium: 4.3 mEq/L (ref 3.5–5.3)

## 2012-07-25 LAB — HEPATIC FUNCTION PANEL
ALT: 10 U/L (ref 0–35)
Alkaline Phosphatase: 72 U/L (ref 39–117)
Indirect Bilirubin: 0.4 mg/dL (ref 0.0–0.9)
Total Protein: 7.6 g/dL (ref 6.0–8.3)

## 2012-07-25 LAB — TSH: TSH: 0.962 u[IU]/mL (ref 0.350–4.500)

## 2012-07-25 NOTE — Telephone Encounter (Signed)
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 She states that the lab has already drawn her blood but she needs the orders released??

## 2012-07-25 NOTE — Telephone Encounter (Signed)
Refaxed to [phone]

## 2012-09-01 ENCOUNTER — Encounter: Payer: 59 | Admitting: Family Medicine

## 2012-09-12 ENCOUNTER — Encounter: Payer: Self-pay | Admitting: Medical

## 2012-09-12 ENCOUNTER — Ambulatory Visit (INDEPENDENT_AMBULATORY_CARE_PROVIDER_SITE_OTHER): Payer: 59 | Admitting: Medical

## 2012-09-12 VITALS — BP 133/82 | HR 72 | Temp 97.5°F | Resp 16

## 2012-09-12 DIAGNOSIS — N390 Urinary tract infection, site not specified: Secondary | ICD-10-CM

## 2012-09-12 LAB — POCT URINALYSIS DIPSTICK
Glucose, UA: NEGATIVE
Spec Grav, UA: 1.015
Urobilinogen, UA: NEGATIVE

## 2012-09-12 MED ORDER — CIPROFLOXACIN HCL 500 MG PO TABS: 500.0000 mg | ORAL_TABLET | Freq: Two times a day (BID) | ORAL | Status: DC

## 2012-09-12 NOTE — Progress Notes (Signed)
Subjective:  Lynn Dunlap is a 42 y.o. female who complains of burning with urination, urinary frequency similar to prior UTI.  She has had symptoms for 3 days.  Patient does not have a history of recurrent UTI. Patient does not have a history of pyelonephritis. Denies vaginal symptoms, no fever, no abdominal or back pain.   ROS as in subjective   Objective:   Filed Vitals:   09/12/12 0804  BP: 133/82  Pulse: 72  Temp: 97.5 F (36.4 C)  Resp: 16    General appearance: alert, no distress, WD/WN, female Abdomen: soft, non tender, non distended, no masses, no hepatomegaly, no splenomegaly Back: no CVA tenderness       Assessment and Plan:   Encounter Diagnosis  Name Primary?  . UTI (urinary tract infection) Yes   Reviewed prior urine culture results in chart.  Begin Cipro, increase water intake, discussed preventative measure.  Call/return if worse or not improving.

## 2012-09-15 ENCOUNTER — Encounter: Payer: Self-pay | Admitting: Family Medicine

## 2012-10-07 ENCOUNTER — Other Ambulatory Visit: Payer: Self-pay | Admitting: Family Medicine

## 2012-10-13 ENCOUNTER — Ambulatory Visit (INDEPENDENT_AMBULATORY_CARE_PROVIDER_SITE_OTHER): Payer: 59 | Admitting: Family Medicine

## 2012-10-13 ENCOUNTER — Other Ambulatory Visit (HOSPITAL_COMMUNITY)
Admission: RE | Admit: 2012-10-13 | Discharge: 2012-10-13 | Disposition: A | Discharge status: Home / Self Care | Payer: 59 | Source: Ambulatory Visit | Attending: Family Medicine | Admitting: Family Medicine

## 2012-10-13 ENCOUNTER — Encounter: Payer: Self-pay | Admitting: Family Medicine

## 2012-10-13 VITALS — BP 102/64 | HR 79 | Temp 97.9°F | Ht 67.0 in | Wt 166.1 lb

## 2012-10-13 DIAGNOSIS — E049 Nontoxic goiter, unspecified: Secondary | ICD-10-CM

## 2012-10-13 DIAGNOSIS — D649 Anemia, unspecified: Secondary | ICD-10-CM

## 2012-10-13 DIAGNOSIS — F329 Major depressive disorder, single episode, unspecified: Secondary | ICD-10-CM

## 2012-10-13 DIAGNOSIS — G43909 Migraine, unspecified, not intractable, without status migrainosus: Secondary | ICD-10-CM

## 2012-10-13 DIAGNOSIS — N390 Urinary tract infection, site not specified: Secondary | ICD-10-CM

## 2012-10-13 DIAGNOSIS — Z01419 Encounter for gynecological examination (general) (routine) without abnormal findings: Secondary | ICD-10-CM | POA: Insufficient documentation

## 2012-10-13 DIAGNOSIS — Z124 Encounter for screening for malignant neoplasm of cervix: Secondary | ICD-10-CM

## 2012-10-13 DIAGNOSIS — Z0000 Encounter for general adult medical examination without abnormal findings: Secondary | ICD-10-CM

## 2012-10-13 LAB — T4, FREE: Free T4: 0.99 ng/dL (ref 0.80–1.80)

## 2012-10-13 LAB — TSH: TSH: 0.838 u[IU]/mL (ref 0.350–4.500)

## 2012-10-13 MED ORDER — ESCITALOPRAM OXALATE 20 MG PO TABS: 20.0000 mg | ORAL_TABLET | Freq: Every day | ORAL | Status: DC

## 2012-10-13 MED ORDER — ALPRAZOLAM 0.25 MG PO TABS: ORAL_TABLET | ORAL | Status: DC

## 2012-10-13 NOTE — Assessment & Plan Note (Addendum)
Pap today, no concerns on exam.  

## 2012-10-13 NOTE — Patient Instructions (Addendum)
Goiter Goiter is an enlarged thyroid gland. The thyroid gland sits at the base of the front of the neck. The gland produces hormones that regulate mood, body temperature, pulse rate, and digestion. Most goiters are painless and are not a cause for serious concern. Goiters and conditions that cause goiters can be treated if necessary.  CAUSES  Common causes of goiter include:  Graves disease (causes too much hormone to be produced [hyperthyroidism]).  Hashimoto's disease (causes too little hormone to be produced [hypothyroidism]).  Thyroiditis (inflammation of the thyroid sometimes caused by virus or pregnancy).  Nodular goiter (small bumps form; sometimes called toxic nodular goiter).  Pregnancy.  Thyroid cancer (very few goiters with nodules are cancerous).  Certain medications.  Radiation exposure.  Iodine deficiency (more common in developing countries in inland populations). RISK FACTORS Risk factors for goiter include:  A family history of goiter.  Female gender.  Inadequate iodine in the diet.  Age older than 40 years. SYMPTOMS  Many goiters do not cause symptoms. When symptoms do occur, they may include:  Swelling in the lower part of the neck. This swelling can range from a very small bump to a large lump.  A tight feeling in the throat.  A hoarse voice. Less commonly, a goiter may result in:  Coughing.  Wheezing.  Difficulty swallowing.  Difficulty breathing.  Bulging neck veins.  Dizziness. When a goiter is the result of hyperthyroidism, symptoms may include:  Rapid or irregular heart beat.  Sicknessin your stomach (nausea).  Vomiting.  Diarrhea.  Shaking.  Irritable feeling.  Bulging eyes.  Weight loss.  Heat sensitivity.  Anxiety. When a goiter is the result of hypothyroidism, symptoms may include:  Tiredness.  Dry skin.  Constipation.  Weight gain.  Irregular menstrual cycle.  Depressed mood.  Sensitivity to  cold. DIAGNOSIS  Tests used to diagnose goiter include:  A physical exam.  Blood tests, including thyroid hormone levels and antibody testing.  Ultrasonography, computerized X-ray scan (computed tomography, CT) or computerized magnetic scan (magnetic resonance imaging, MRI).  Thyroid scan (imaging along with safe radioactive injection).  Tissue sample taken (biopsy) of nodules. This is sometimes done to confirm that the nodules are not cancerous. TREATMENT  Treatment will depend on the cause of the goiter. Treatment may include:  Monitoring. In some cases, no treatment is necessary, and your doctor will monitor yourcondition at regular check ups.  Medications and supplements. Thyroid medication (thyroid hormone replacement) is available for hyperthroidism and hypothyroidism.  If inflammation is the cause, over-the-counter medication or steroid medication may be recommended.  Goiters caused by iodine deficiency can be treated with iodine supplements or changes in diet.  Radioactive iodine treatment. Radioactive iodine is injected into the blood. It travels to the thyroid gland, kills thyroid cells, and reduces the size of the gland. This is only used when the thyroid gland is overactive. Lifelong thyroid hormone medication is often necessary after this treatment.  Surgery. A procedure to remove all or part of the gland may be recommended in severe cases or when cancer is the cause. Hormones can be taken to replace the hormones normally produced by the thyroid. HOME CARE INSTRUCTIONS   Take medications as directed.  Follow your caregiver's recommendations for any dietary changes.  Follow up with your caregiver for further examination and testing, as directed. PREVENTION   If you have a family history of goiter, discuss screening with your doctor.  Make sure you are getting enough iodine in your diet.  Use   of iodized table salt can help prevent iodine deficiency. Document  Released: 08/27/2009 Document Revised: 06/01/2011 Document Reviewed: 08/27/2009 Exit[employer] Patient Information 2014 [employer].  

## 2012-10-16 DIAGNOSIS — Z0000 Encounter for general adult medical examination without abnormal findings: Secondary | ICD-10-CM | POA: Insufficient documentation

## 2012-10-16 NOTE — Assessment & Plan Note (Signed)
resolved 

## 2012-10-16 NOTE — Assessment & Plan Note (Signed)
Lexapro helps but will increase to 20 mg daily, continue infrequent use of Alprazolam prn

## 2012-10-16 NOTE — Assessment & Plan Note (Signed)
No recent symptoms

## 2012-10-16 NOTE — Assessment & Plan Note (Signed)
Encouraged heart healthy diet, regular exercise, good sleep each night

## 2012-10-16 NOTE — Assessment & Plan Note (Signed)
Infrequent use of meds with good results. Encouraged regular sleep good hydration and small, frequent meals.

## 2012-10-16 NOTE — Progress Notes (Signed)
Patient ID: Lynn Dunlap, female   DOB: 10-04-1970, 42 y.o.   MRN: [id number] Raygan Skarda Crudup [id number] 11-Jun-1970 10/16/2012      Progress Note-Follow Up  Subjective  Chief Complaint  Chief Complaint  Patient presents with  . Annual Exam    physical  . Gynecologic Exam    pap    HPI  Patient is a 42 year old female in today for annual exam. Doing well, tried to come off of the Lexapro but struggled with increased anxiety and irritability. Since restarting she is doing better but still struggling with stress. No   Past Medical History  Diagnosis Date  . Depression 08/10/2010  . Preventative health care 01/02/2011  . Headache(784.0)   . Anxiety   . Anemia 01/17/2012  . Neutropenia 01/17/2012  . Cervical cancer screening 01/02/2011    Menarche at 12 G2P2 s/p 1 svd and 1 c/s, tubal No abnl paps or MGMs     Past Surgical History  Procedure Laterality Date  . Cesarean section      X 1 w/ tubal  . Tubal ligation      Family History  Problem Relation Age of Onset  . Cancer Mother     sarcoma, gyn cancer w/ hysterectomy  . Hypertension Father   . Asthma Brother   . Cancer Maternal Grandfather     prostate  . Hyperlipidemia Paternal Grandmother   . Glaucoma Paternal Grandfather     History   Social History  . Marital Status: Married    Spouse Name: N/A    Number of Children: N/A  . Years of Education: N/A   Occupational History  . Not on file.   Social History Main Topics  . Smoking status: Never Smoker   . Smokeless tobacco: Never Used  . Alcohol Use: No  . Drug Use: No  . Sexually Active: Yes -- Female partner(s)   Other Topics Concern  . Not on file   Social History Narrative  . No narrative on file    Current Outpatient Prescriptions on File Prior to Visit  Medication Sig Dispense Refill  . butalbital-acetaminophen-caffeine (FIORICET) 50-325-40 MG per tablet Take 1 tablet by mouth every 6 (six) hours as needed for pain or headache.  60 tablet  1   . promethazine (PHENERGAN) 25 MG suppository INSERT 1 SUPPOSITORY PER RECTALLY EVERY 8 HOURS AS NEEDED FOR NAUSEA & VOMITING  10 suppository  0  . promethazine (PHENERGAN) 25 MG suppository Place 1 suppository (25 mg total) rectally every 6 (six) hours as needed for nausea.  12 each  0  . acyclovir (ZOVIRAX) 400 MG tablet Take 1 tablet (400 mg total) by mouth 3 (three) times daily. Take for 7-10 days  30 tablet  1   No current facility-administered medications on file prior to visit.    Allergies  Allergen Reactions  . Doxycycline   . Erythromycin   . Sulfonamide Derivatives     Review of Systems  Review of Systems  Constitutional: Negative for fever, chills and malaise/fatigue.  HENT: Negative for hearing loss, nosebleeds and congestion.   Eyes: Negative for discharge.  Respiratory: Negative for cough, sputum production, shortness of breath and wheezing.   Cardiovascular: Negative for chest pain, palpitations and leg swelling.  Gastrointestinal: Negative for heartburn, nausea, vomiting, abdominal pain, diarrhea, constipation and blood in stool.  Genitourinary: Negative for dysuria, urgency, frequency and hematuria.  Musculoskeletal: Negative for myalgias, back pain and falls.  Skin: Negative for rash.  Neurological: Negative for  dizziness, tremors, sensory change, focal weakness, loss of consciousness, weakness and headaches.  Endo/Heme/Allergies: Negative for polydipsia. Does not bruise/bleed easily.  Psychiatric/Behavioral: Negative for depression and suicidal ideas. The patient is not nervous/anxious and does not have insomnia.     Objective  BP 102/64  Pulse 79  Temp(Src) 97.9 F (36.6 C) (Oral)  Ht 5\' 7"  (1.702 m)  Wt 166 lb 1.9 oz (75.352 kg)  BMI 26.01 kg/m2  SpO2 94%  LMP 09/26/2012  Physical Exam  Physical Exam  Constitutional: She is oriented to person, place, and time and well-developed, well-nourished, and in no distress. No distress.  HENT:  Head:  Normocephalic and atraumatic.  Right Ear: External ear normal.  Left Ear: External ear normal.  Nose: Nose normal.  Mouth/Throat: Oropharynx is clear and moist. No oropharyngeal exudate.  Eyes: Conjunctivae are normal. Pupils are equal, round, and reactive to light. Right eye exhibits no discharge. Left eye exhibits no discharge. No scleral icterus.  Neck: Normal range of motion. Neck supple. No thyromegaly present.  Cardiovascular: Normal rate, regular rhythm and intact distal pulses.  Exam reveals no gallop.   No murmur heard. Pulmonary/Chest: Effort normal and breath sounds normal. No respiratory distress. She has no wheezes. She has no rales.  Abdominal: Soft. Bowel sounds are normal. She exhibits no distension and no mass. There is no tenderness.  Musculoskeletal: Normal range of motion. She exhibits no edema and no tenderness.  Lymphadenopathy:    She has no cervical adenopathy.  Neurological: She is alert and oriented to person, place, and time. She has normal reflexes. No cranial nerve deficit. Coordination normal.  Skin: Skin is warm and dry. No rash noted. She is not diaphoretic.  Psychiatric: Mood, memory and affect normal.    Lab Results  Component Value Date   TSH 0.838 10/13/2012   Lab Results  Component Value Date   WBC 5.1 07/25/2012   HGB 12.2 07/25/2012   HCT 36.5 07/25/2012   MCV 86.7 07/25/2012   PLT 210 07/25/2012   Lab Results  Component Value Date   CREATININE 0.78 07/25/2012   BUN 8 07/25/2012   NA 137 07/25/2012   K 4.3 07/25/2012   CL 104 07/25/2012   CO2 25 07/25/2012   Lab Results  Component Value Date   ALT 10 07/25/2012   AST 18 07/25/2012   ALKPHOS 72 07/25/2012   BILITOT 0.5 07/25/2012   Lab Results  Component Value Date   CHOL 156 01/07/2012   Lab Results  Component Value Date   HDL 53.60 01/07/2012   Lab Results  Component Value Date   LDLCALC 94 01/07/2012   Lab Results  Component Value Date   TRIG 40.0 01/07/2012   Lab Results  Component Value Date    CHOLHDL 3 01/07/2012     Assessment & Plan  Cervical cancer screening Pap today, no concerns on exam  Depression Lexapro helps but will increase to 20 mg daily, continue infrequent use of Alprazolam prn   MIGRAINE HEADACHE Infrequent use of meds with good results. Encouraged regular sleep good hydration and small, frequent meals.   UTI (urinary tract infection) No recent symptoms  Anemia resolved  Preventative health care Encouraged heart healthy diet, regular exercise, good sleep each night

## 2012-10-25 ENCOUNTER — Ambulatory Visit (INDEPENDENT_AMBULATORY_CARE_PROVIDER_SITE_OTHER): Payer: 59 | Admitting: Medical

## 2012-10-25 ENCOUNTER — Encounter: Payer: Self-pay | Admitting: Medical

## 2012-10-25 VITALS — BP 156/98 | HR 64

## 2012-10-25 DIAGNOSIS — G43901 Migraine, unspecified, not intractable, with status migrainosus: Secondary | ICD-10-CM

## 2012-10-25 MED ORDER — ELETRIPTAN HYDROBROMIDE 40 MG PO TABS: 40.0000 mg | ORAL_TABLET | ORAL | Status: DC | PRN

## 2012-10-25 MED ORDER — PROMETHAZINE HCL 25 MG/ML IJ SOLN
25.0000 mg | Freq: Four times a day (QID) | INTRAMUSCULAR | Status: DC | PRN
  Administered 2012-10-27: 25 mg via INTRAMUSCULAR

## 2012-10-25 NOTE — Progress Notes (Signed)
Subjective Lynn Dunlap presents for evaluation of headache.  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  She had abrupt onset of blurry vision, followed a little later by left sided headache.  Denies recent head injury, fall, hasn't skipped breakfast today.   She does have hx/o migraines, and has had aura in the past similar to this.   She notes worsening headache, left temporal/frontal region, and after a while started getting nausea.  She denies loss of vision, no numbness, no tingling, no weakness, no nose bleed. No slurred speech.  Has taken nothing for symptoms this morning.  She has not vomited.  Denies a specific trigger today.   Gets migraines several times a month, but no prophylactic medication so far.  Has Fioricet at home that has been prescribed by her PCM, Dr. Rogelia Rohrer.  Past Medical History  Diagnosis Date  . Depression 08/10/2010  . Preventative health care 01/02/2011  . Headache(784.0)   . Anxiety   . Anemia 01/17/2012  . Neutropenia 01/17/2012  . Cervical cancer screening 01/02/2011    Menarche at 35 G2P2 s/p 1 svd and 1 c/s, tubal No abnl paps or MGMs    Review of Systems Constitutional: -fever, -chills, -sweats, -unexpected weight change ENT: -runny nose, -ear pain, -sore throat Cardiology:  -chest pain, -palpitations, -edema Respiratory: -cough, -shortness of breath, -wheezing Gastroenterology: -abdominal pain, -nausea, -vomiting, -diarrhea, -constipation  Hematology: -bleeding or bruising problems Musculoskeletal: -arthralgias, -myalgias, -joint swelling, -back pain Urology: -dysuria, -difficulty urinating, -hematuria, -urinary frequency, -urgency Neurology: -weakness, -tingling, -numbness    Objective:   Physical Exam  Filed Vitals:   10/25/12 1432  BP: 156/98  Pulse: 64    General appearance: alert, no distress, WD/WN, appears to be in pain HEENT: normocephalic, sclerae anicteric, PERRLA, EOMi, nares patent, no discharge or  erythema, pharynx normal Neck: supple, no lymphadenopathy, no thyromegaly, no masses Heart: RRR, normal S1, S2, no murmurs Neurological: alert, oriented x 3, CN2-12 intact,nonfocal exam Psychiatric: normal affect, behavior normal, pleasant    Assessment and Plan :    Encounter Diagnosis  Name Primary?  . Migraine with status migrainosus Yes   Discussed case with Dr. Susann Givens who also evaluated patient.   initially gave Relpax sample in office within 15 minutes of onset of headaches.   This quickly improved her blurred vision, but headache gradually worsened.   She went and lied down in dark cool room.  She took 400mg  of Ibuprofen.  After about an hour, didn't feel much improvement in the headache, and also started getting worse nausea, however ,the visual blurriness remained improved.   After discussing options, gave 25mg  Phenergan IM, gave second Relpax at 2:30pm, 2 hours after first dose, and her husband came and picked her up.  Advised rest in cool dark room, don't take anything else until 2-3 hours from now.   At that time if needed, can use a Fioricet she has at home.   Advised her an husband if any altered mental status, weakness, numbness, tingling, confusion, or abnormality from her norm, go to the ED/call 911.

## 2012-10-25 NOTE — Patient Instructions (Signed)
Ocular Migraine:   Take the second Relpax at 2:30pm or later.  We gave you a shot of Phenergan 25mg  in the office.    Go home and rest in dark quiet room   If you don't seem to improve by 5pm or later, you can use your Fioricet, but I would give these current medications a chance to work.

## 2012-11-07 ENCOUNTER — Other Ambulatory Visit: Payer: Self-pay | Admitting: *Deleted

## 2012-11-07 MED ORDER — NEOMYCIN-POLYMYXIN-HC 3.5-10000-1 OT SUSP: 3.0000 [drp] | Freq: Two times a day (BID) | OTIC | Status: DC

## 2012-11-08 ENCOUNTER — Encounter: Payer: Self-pay | Admitting: Family Medicine

## 2012-11-09 ENCOUNTER — Ambulatory Visit (INDEPENDENT_AMBULATORY_CARE_PROVIDER_SITE_OTHER): Payer: 59 | Admitting: Family Medicine

## 2012-11-09 ENCOUNTER — Encounter: Payer: Self-pay | Admitting: Family Medicine

## 2012-11-09 VITALS — BP 128/84 | HR 72 | Temp 98.3°F | Ht 66.0 in | Wt 160.0 lb

## 2012-11-09 DIAGNOSIS — H9209 Otalgia, unspecified ear: Secondary | ICD-10-CM

## 2012-11-09 DIAGNOSIS — L723 Sebaceous cyst: Secondary | ICD-10-CM

## 2012-11-09 DIAGNOSIS — H9202 Otalgia, left ear: Secondary | ICD-10-CM

## 2012-11-09 NOTE — Patient Instructions (Signed)
Use warm compresses to the affected area of left ear (10-15 minutes, multiple times throughout the day, at least 3-4).  You can either apply bacitracin to the affected area, or use the drops that you have on gauze placed in the ear (in order to actually get the affected area).  Let me know if it is getting larger, worse, fever, more painful.  Then we might need to call in oral antibiotics, versus try draining again (or both)

## 2012-11-09 NOTE — Progress Notes (Signed)
Chief Complaint  Patient presents with  . Otalgia    left ear pain and swelling, saw [facility] Monday and was given ear drops, not working. Has actually worsened.   Patient presents with complaint of left ear pain.  She was seen by Dr. Susann Givens 2 days ago, and cortisporin drops were called in.  She presents today with increasing pain and swelling.  Pain had started over the weekend.  Her husband noted a "little red bump".  Husband stuck a pin in it, only blood drained, no pus. Used the drops twice yesterday (only 2 doses so far).  Continues to have pain.  Husband notes it is more swollen.  Had some drainage this morning after getting scab off--some pus and blood. Denies any decrease in hearing.  Past Medical History  Diagnosis Date  . Depression 08/10/2010  . Preventative health care 01/02/2011  . Headache(784.0)   . Anxiety   . Anemia 01/17/2012  . Neutropenia 01/17/2012  . Cervical cancer screening 01/02/2011    Menarche at 89 G2P2 s/p 1 svd and 1 c/s, tubal No abnl paps or MGMs    History   Social History  . Marital Status: Married    Spouse Name: N/A    Number of Children: N/A  . Years of Education: N/A   Occupational History  . Not on file.   Social History Main Topics  . Smoking status: Never Smoker   . Smokeless tobacco: Never Used  . Alcohol Use: No  . Drug Use: No  . Sexual Activity: Yes    Partners: Male   Other Topics Concern  . Not on file   Social History Narrative  . No narrative on file   Current outpatient prescriptions:ALPRAZolam (XANAX) 0.25 MG tablet, 1/2 to 1 tab po q 12 hour prn anxiety, Disp: 30 tablet, Rfl: 0;  escitalopram (LEXAPRO) 20 MG tablet, Take 1 tablet (20 mg total) by mouth daily., Disp: 90 tablet, Rfl: 3;  neomycin-polymyxin-hydrocortisone (CORTISPORIN) 3.5-10000-1 otic suspension, Place 3 drops into the left ear 2 (two) times daily., Disp: 10 mL, Rfl: 0 acyclovir (ZOVIRAX) 400 MG tablet, Take 1 tablet (400 mg total) by mouth 3 (three) times  daily. Take for 7-10 days, Disp: 30 tablet, Rfl: 1;  butalbital-acetaminophen-caffeine (FIORICET) 50-325-40 MG per tablet, Take 1 tablet by mouth every 6 (six) hours as needed for pain or headache., Disp: 60 tablet, Rfl: 1 eletriptan (RELPAX) 40 MG tablet, Take 1 tablet (40 mg total) by mouth as needed for migraine. One tablet by mouth at onset of headache. May repeat in 2 hours if headache persists or recurs., Disp: 2 tablet, Rfl: 0 Current facility-administered medications:promethazine (PHENERGAN) injection 25 mg, 25 mg, Intramuscular, Q6H PRN, Kermit Balo Tysinger, PA-C, 25 mg at 10/27/12 1147  Allergies  Allergen Reactions  . Doxycycline   . Erythromycin   . Sulfonamide Derivatives    ROS: Denies fevers, chills, nausea, vomiting, URI symptoms.  PHYSICAL EXAM: BP 128/84  Pulse 72  Temp(Src) 98.3 F (36.8 C) (Oral)  Ht 5\' 6"  (1.676 m)  Wt 160 lb (72.576 kg)  BMI 25.84 kg/m2  LMP 10/27/2012  Well developed female in no distress HEENT:  Right TM and EAC normal.  Left TM normal.  Anteriorly, in distal canal there is an area of soft tissue swelling, with overlying small ulceration (where scab was removed, likely where husband had poked with pin).  There is some cotton stuck to the edge of this.  There is no erythema of the surrounding skin,  or surrounding soft tissue swelling.  There is no purulence or crusting in the canal.  +tender  ASSESSMENT/PLAN:  Otalgia, left  Sebaceous cyst of ear - left   Cyst/abscess in L ear.  Recommended warm compresses.  Can either use topical antibiotics such as bacitracin, or use the cortisporin on gauze--otherwise the drops are likely going deeper into the canal, and not staying on the affected area.  If worsening swelling, pain, redness, fevers, may need oral antibiotic (ie Keflex), +/- I&D (not indicated currently).

## 2012-11-09 NOTE — Telephone Encounter (Signed)
Please advise? Pt aware that you are out of the office until Monday

## 2012-11-15 NOTE — Telephone Encounter (Signed)
Please advise 

## 2012-11-15 NOTE — Telephone Encounter (Signed)
Sent mychart message to patient

## 2012-12-02 ENCOUNTER — Encounter: Payer: Self-pay | Admitting: Family Medicine

## 2012-12-02 ENCOUNTER — Ambulatory Visit (INDEPENDENT_AMBULATORY_CARE_PROVIDER_SITE_OTHER): Payer: 59 | Admitting: Family Medicine

## 2012-12-02 VITALS — BP 152/94 | HR 62 | Temp 97.9°F | Ht 67.0 in | Wt 170.0 lb

## 2012-12-02 DIAGNOSIS — Z6825 Body mass index (BMI) 25.0-25.9, adult: Secondary | ICD-10-CM

## 2012-12-02 DIAGNOSIS — E663 Overweight: Secondary | ICD-10-CM

## 2012-12-02 DIAGNOSIS — IMO0001 Reserved for inherently not codable concepts without codable children: Secondary | ICD-10-CM

## 2012-12-02 DIAGNOSIS — R030 Elevated blood-pressure reading, without diagnosis of hypertension: Secondary | ICD-10-CM | POA: Insufficient documentation

## 2012-12-02 HISTORY — DX: Reserved for inherently not codable concepts without codable children: IMO0001

## 2012-12-02 HISTORY — DX: Overweight: E66.3

## 2012-12-02 LAB — TSH: TSH: 0.674 u[IU]/mL (ref 0.350–4.500)

## 2012-12-02 LAB — T4, FREE: Free T4: 1.12 ng/dL (ref 0.80–1.80)

## 2012-12-02 MED ORDER — PHENTERMINE-TOPIRAMATE ER 7.5-46 MG PO CP24: 1.0000 | ORAL_CAPSULE | Freq: Every day | ORAL | Status: DC

## 2012-12-02 NOTE — Progress Notes (Signed)
Patient ID: Lynn Dunlap, female   DOB: [birth year], 42 y.o.   MRN: [id number] Lynn Dunlap [id number] 25-May-1970 12/02/2012      Progress Note-Follow Up  Subjective  Chief Complaint  Chief Complaint  Patient presents with  . discuss weight loss    HPI  Patient is a 42 year old American female who is in today frustrated with weight gain. She is exercising regularly and trying to keep her caloric intake under 1500 calories a day but unfortunately continues to gain weight. She is interested in starting medications in speaking with the nutritionist. She denies any other recent illness although she does acknowledge a great deal of stress. No chest pain or palpitations. No headaches , shortness of breath, GI or GU complaints.  Past Medical History  Diagnosis Date  . Depression 08/10/2010  . Preventative health care 01/02/2011  . Headache(784.0)   . Anxiety   . Anemia 01/17/2012  . Neutropenia 01/17/2012  . Cervical cancer screening 01/02/2011    Menarche at 14 G2P2 s/p 1 svd and 1 c/s, tubal No abnl paps or MGMs   . Overweight (BMI 25.0-29.9) 12/02/2012  . Elevated BP 12/02/2012    Past Surgical History  Procedure Laterality Date  . Cesarean section      X 1 w/ tubal  . Tubal ligation      Family History  Problem Relation Age of Onset  . Cancer Mother     sarcoma, gyn cancer w/ hysterectomy  . Hypertension Father   . Asthma Brother   . Cancer Maternal Grandfather     prostate  . Hyperlipidemia Paternal Grandmother   . Glaucoma Paternal Grandfather     History   Social History  . Marital Status: Married    Spouse Name: N/A    Number of Children: N/A  . Years of Education: N/A   Occupational History  . Not on file.   Social History Main Topics  . Smoking status: Never Smoker   . Smokeless tobacco: Never Used  . Alcohol Use: No  . Drug Use: No  . Sexual Activity: Yes    Partners: Male   Other Topics Concern  . Not on file   Social History Narrative  .  No narrative on file    Current Outpatient Prescriptions on File Prior to Visit  Medication Sig Dispense Refill  . ALPRAZolam (XANAX) 0.25 MG tablet 1/2 to 1 tab po q 12 hour prn anxiety  30 tablet  0  . escitalopram (LEXAPRO) 20 MG tablet Take 1 tablet (20 mg total) by mouth daily.  90 tablet  3  . acyclovir (ZOVIRAX) 400 MG tablet Take 1 tablet (400 mg total) by mouth 3 (three) times daily. Take for 7-10 days  30 tablet  1   No current facility-administered medications on file prior to visit.    Allergies  Allergen Reactions  . Doxycycline   . Erythromycin   . Sulfonamide Derivatives     Review of Systems  Review of Systems  Constitutional: Negative for fever and malaise/fatigue.  HENT: Negative for congestion.   Eyes: Negative for discharge.  Respiratory: Negative for shortness of breath.   Cardiovascular: Negative for chest pain, palpitations and leg swelling.  Gastrointestinal: Negative for nausea, abdominal pain and diarrhea.  Genitourinary: Negative for dysuria.  Musculoskeletal: Negative for falls.  Skin: Negative for rash.  Neurological: Negative for loss of consciousness and headaches.  Endo/Heme/Allergies: Negative for polydipsia.  Psychiatric/Behavioral: Negative for depression and suicidal ideas. The patient is not  nervous/anxious and does not have insomnia.     Objective  BP 152/94  Pulse 62  Temp(Src) 97.9 F (36.6 C) (Oral)  Ht 5\' 7"  (1.702 m)  Wt 170 lb (77.111 kg)  BMI 26.62 kg/m2  SpO2 97%  LMP 11/21/2012  Physical Exam  Physical Exam  Constitutional: She is oriented to person, place, and time and well-developed, well-nourished, and in no distress. No distress.  HENT:  Head: Normocephalic and atraumatic.  Eyes: Conjunctivae are normal.  Neck: Neck supple. No thyromegaly present.  Cardiovascular: Normal rate, regular rhythm and normal heart sounds.   No murmur heard. Pulmonary/Chest: Effort normal and breath sounds normal. She has no  wheezes.  Abdominal: She exhibits no distension and no mass.  Musculoskeletal: She exhibits no edema.  Lymphadenopathy:    She has no cervical adenopathy.  Neurological: She is alert and oriented to person, place, and time.  Skin: Skin is warm and dry. No rash noted. She is not diaphoretic.  Psychiatric: Memory, affect and judgment normal.    Lab Results  Component Value Date   TSH 0.838 10/13/2012   Lab Results  Component Value Date   WBC 5.1 07/25/2012   HGB 12.2 07/25/2012   HCT 36.5 07/25/2012   MCV 86.7 07/25/2012   PLT 210 07/25/2012   Lab Results  Component Value Date   CREATININE 0.78 07/25/2012   BUN 8 07/25/2012   NA 137 07/25/2012   K 4.3 07/25/2012   CL 104 07/25/2012   CO2 25 07/25/2012   Lab Results  Component Value Date   ALT 10 07/25/2012   AST 18 07/25/2012   ALKPHOS 72 07/25/2012   BILITOT 0.5 07/25/2012   Lab Results  Component Value Date   CHOL 156 01/07/2012   Lab Results  Component Value Date   HDL 53.60 01/07/2012   Lab Results  Component Value Date   LDLCALC 94 01/07/2012   Lab Results  Component Value Date   TRIG 40.0 01/07/2012   Lab Results  Component Value Date   CHOLHDL 3 01/07/2012     Assessment & Plan  Overweight (BMI 25.0-29.9) Patient exercising and trying to eat right and weight continues to climb she is very frustarted, Started on Qsymia and referred to Nutritionist, encouraged DASH diet, probiotics, good sleep and regular exercise. Reassess at visit in 3 weeks. Check thyroid studies  Elevated BP Improved on recheck patient with caffeine intake prior to visit and increased stress due to husband's back surgery. Will recheck at next visit, encouraged DASH diet and minimize caffeine, report any concerning symptoms

## 2012-12-02 NOTE — Assessment & Plan Note (Signed)
Improved on recheck patient with caffeine intake prior to visit and increased stress due to husband's back surgery. Will recheck at next visit, encouraged DASH diet and minimize caffeine, report any concerning symptoms

## 2012-12-02 NOTE — Patient Instructions (Addendum)
Tart a probiotic such as Digestive Advantage  DASH Diet The DASH diet stands for "Dietary Approaches to Stop Hypertension." It is a healthy eating plan that has been shown to reduce high blood pressure (hypertension) in as little as 14 days, while also possibly providing other significant health benefits. These other health benefits include reducing the risk of breast cancer after menopause and reducing the risk of type 2 diabetes, heart disease, colon cancer, and stroke. Health benefits also include weight loss and slowing kidney failure in patients with chronic kidney disease.  DIET GUIDELINES  Limit salt (sodium). Your diet should contain less than 1500 mg of sodium daily.  Limit refined or processed carbohydrates. Your diet should include mostly whole grains. Desserts and added sugars should be used sparingly.  Include small amounts of heart-healthy fats. These types of fats include nuts, oils, and tub margarine. Limit saturated and trans fats. These fats have been shown to be harmful in the body. CHOOSING FOODS  The following food groups are based on a 2000 calorie diet. See your Registered Dietitian for individual calorie needs. Grains and Grain Products (6 to 8 servings daily)  Eat More Often: Whole-wheat bread, brown rice, whole-grain or wheat pasta, quinoa, popcorn without added fat or salt (air popped).  Eat Less Often: White bread, white pasta, white rice, cornbread. Vegetables (4 to 5 servings daily)  Eat More Often: Fresh, frozen, and canned vegetables. Vegetables may be raw, steamed, roasted, or grilled with a minimal amount of fat.  Eat Less Often/Avoid: Creamed or fried vegetables. Vegetables in a cheese sauce. Fruit (4 to 5 servings daily)  Eat More Often: All fresh, canned (in natural juice), or frozen fruits. Dried fruits without added sugar. One hundred percent fruit juice ( cup [237 mL] daily).  Eat Less Often: Dried fruits with added sugar. Canned fruit in light or  heavy syrup. [employer], Fish, and Poultry (2 servings or less daily. One serving is 3 to 4 oz [85-114 g]).  Eat More Often: Ninety percent or leaner ground beef, tenderloin, sirloin. Round cuts of beef, chicken breast, [place] breast. All fish. Grill, bake, or broil your meat. Nothing should be fried.  Eat Less Often/Avoid: Fatty cuts of meat, [place], or chicken leg, thigh, or wing. Fried cuts of meat or fish. Dairy (2 to 3 servings)  Eat More Often: Low-fat or fat-free milk, low-fat plain or light yogurt, reduced-fat or part-skim cheese.  Eat Less Often/Avoid: Milk (whole, 2%).Whole milk yogurt. Full-fat cheeses. Nuts, Seeds, and Legumes (4 to 5 servings per week)  Eat More Often: All without added salt.  Eat Less Often/Avoid: Salted nuts and seeds, canned beans with added salt. Fats and Sweets (limited)  Eat More Often: Vegetable oils, tub margarines without trans fats, sugar-free gelatin. Mayonnaise and salad dressings.  Eat Less Often/Avoid: Coconut oils, palm oils, butter, stick margarine, cream, half and half, cookies, candy, pie. FOR MORE INFORMATION The Dash Diet Eating Plan: www.dashdiet.org Document Released: 02/26/2011 Document Revised: 06/01/2011 Document Reviewed: 02/26/2011 [facility] Patient Information 2014 [place], [place].

## 2012-12-02 NOTE — Assessment & Plan Note (Addendum)
Patient exercising and trying to eat right and weight continues to climb she is very frustarted, Started on Qsymia and referred to Nutritionist, encouraged DASH diet, probiotics, good sleep and regular exercise. Reassess at visit in 3 weeks. Check thyroid studies

## 2012-12-05 ENCOUNTER — Encounter: Payer: Self-pay | Admitting: Family Medicine

## 2012-12-06 MED ORDER — TOPIRAMATE 25 MG PO TABS: 25.0000 mg | ORAL_TABLET | Freq: Two times a day (BID) | ORAL | Status: DC

## 2012-12-06 MED ORDER — PHENTERMINE HCL 15 MG PO CAPS: 15.0000 mg | ORAL_CAPSULE | ORAL | Status: DC

## 2012-12-22 ENCOUNTER — Encounter: Payer: Self-pay | Admitting: Family Medicine

## 2012-12-22 ENCOUNTER — Ambulatory Visit (INDEPENDENT_AMBULATORY_CARE_PROVIDER_SITE_OTHER): Payer: 59 | Admitting: Family Medicine

## 2012-12-22 VITALS — BP 122/84 | HR 69 | Temp 97.9°F | Ht 67.0 in | Wt 163.0 lb

## 2012-12-22 DIAGNOSIS — Z6825 Body mass index (BMI) 25.0-25.9, adult: Secondary | ICD-10-CM

## 2012-12-22 DIAGNOSIS — E663 Overweight: Secondary | ICD-10-CM

## 2012-12-22 DIAGNOSIS — R030 Elevated blood-pressure reading, without diagnosis of hypertension: Secondary | ICD-10-CM

## 2012-12-22 MED ORDER — TOPIRAMATE 25 MG PO TABS: 25.0000 mg | ORAL_TABLET | Freq: Two times a day (BID) | ORAL | Status: DC

## 2012-12-22 MED ORDER — PHENTERMINE HCL 15 MG PO CAPS: 15.0000 mg | ORAL_CAPSULE | ORAL | Status: DC

## 2012-12-22 NOTE — Patient Instructions (Addendum)
DASH Diet  The DASH diet stands for "Dietary Approaches to Stop Hypertension." It is a healthy eating plan that has been shown to reduce high blood pressure (hypertension) in as little as 14 days, while also possibly providing other significant health benefits. These other health benefits include reducing the risk of breast cancer after menopause and reducing the risk of type 2 diabetes, heart disease, colon cancer, and stroke. Health benefits also include weight loss and slowing kidney failure in patients with chronic kidney disease.   DIET GUIDELINES  · Limit salt (sodium). Your diet should contain less than 1500 mg of sodium daily.  · Limit refined or processed carbohydrates. Your diet should include mostly whole grains. Desserts and added sugars should be used sparingly.  · Include small amounts of heart-healthy fats. These types of fats include nuts, oils, and tub margarine. Limit saturated and trans fats. These fats have been shown to be harmful in the body.  CHOOSING FOODS   The following food groups are based on a 2000 calorie diet. See your Registered Dietitian for individual calorie needs.  Grains and Grain Products (6 to 8 servings daily)  · Eat More Often: Whole-wheat bread, brown rice, whole-grain or wheat pasta, quinoa, popcorn without added fat or salt (air popped).  · Eat Less Often: White bread, white pasta, white rice, cornbread.  Vegetables (4 to 5 servings daily)  · Eat More Often: Fresh, frozen, and canned vegetables. Vegetables may be raw, steamed, roasted, or grilled with a minimal amount of fat.  · Eat Less Often/Avoid: Creamed or fried vegetables. Vegetables in a cheese sauce.  Fruit (4 to 5 servings daily)  · Eat More Often: All fresh, canned (in natural juice), or frozen fruits. Dried fruits without added sugar. One hundred percent fruit juice (½ cup [237 mL] daily).  · Eat Less Often: Dried fruits with added sugar. Canned fruit in light or heavy syrup.  Lean Meats, Fish, and Poultry (2  servings or less daily. One serving is 3 to 4 oz [85-114 g]).  · Eat More Often: Ninety percent or leaner ground beef, tenderloin, sirloin. Round cuts of beef, chicken breast, turkey breast. All fish. Grill, bake, or broil your meat. Nothing should be fried.  · Eat Less Often/Avoid: Fatty cuts of meat, turkey, or chicken leg, thigh, or wing. Fried cuts of meat or fish.  Dairy (2 to 3 servings)  · Eat More Often: Low-fat or fat-free milk, low-fat plain or light yogurt, reduced-fat or part-skim cheese.  · Eat Less Often/Avoid: Milk (whole, 2%). Whole milk yogurt. Full-fat cheeses.  Nuts, Seeds, and Legumes (4 to 5 servings per week)  · Eat More Often: All without added salt.  · Eat Less Often/Avoid: Salted nuts and seeds, canned beans with added salt.  Fats and Sweets (limited)  · Eat More Often: Vegetable oils, tub margarines without trans fats, sugar-free gelatin. Mayonnaise and salad dressings.  · Eat Less Often/Avoid: Coconut oils, palm oils, butter, stick margarine, cream, half and half, cookies, candy, pie.  FOR MORE INFORMATION  The Dash Diet Eating Plan: www.dashdiet.org  Document Released: 02/26/2011 Document Revised: 06/01/2011 Document Reviewed: 02/26/2011  [employer]® Patient Information ©2014 [employer].

## 2012-12-25 ENCOUNTER — Encounter: Payer: Self-pay | Admitting: Family Medicine

## 2012-12-25 NOTE — Progress Notes (Signed)
Patient ID: Lynn Dunlap, female   DOB: [birth year], 42 y.o.   MRN: [id number] Lynn Dunlap [id number] 07-30-1970 12/25/2012      Progress Note-Follow Up  Subjective  Chief Complaint  Chief Complaint  Patient presents with  . Follow-up    3 week    HPI  Patient is a 42 year old female who is in today for a medication check. She was started on Topamax and phentermine at her request for weight loss. She reports she's tolerated well. She denies headache, chest pain, insomnia, anxiety, palpitations, GI or GU concerns. He has been exercising more and eating less and feels well at today's visit. Had her flu shot on 12/19/2012.  Past Medical History  Diagnosis Date  . Depression 08/10/2010  . Preventative health care 01/02/2011  . Headache(784.0)   . Anxiety   . Anemia 01/17/2012  . Neutropenia 01/17/2012  . Cervical cancer screening 01/02/2011    Menarche at 14 G2P2 s/p 1 svd and 1 c/s, tubal No abnl paps or MGMs   . Overweight (BMI 25.0-29.9) 12/02/2012  . Elevated BP 12/02/2012    Past Surgical History  Procedure Laterality Date  . Cesarean section      X 1 w/ tubal  . Tubal ligation      Family History  Problem Relation Age of Onset  . Cancer Mother     sarcoma, gyn cancer w/ hysterectomy  . Hypertension Father   . Asthma Brother   . Cancer Maternal Grandfather     prostate  . Hyperlipidemia Paternal Grandmother   . Glaucoma Paternal Grandfather     History   Social History  . Marital Status: Married    Spouse Name: N/A    Number of Children: N/A  . Years of Education: N/A   Occupational History  . Not on file.   Social History Main Topics  . Smoking status: Never Smoker   . Smokeless tobacco: Never Used  . Alcohol Use: No  . Drug Use: No  . Sexual Activity: Yes    Partners: Male   Other Topics Concern  . Not on file   Social History Narrative  . No narrative on file    Current Outpatient Prescriptions on File Prior to Visit  Medication Sig  Dispense Refill  . ALPRAZolam (XANAX) 0.25 MG tablet 1/2 to 1 tab po q 12 hour prn anxiety  30 tablet  0  . escitalopram (LEXAPRO) 20 MG tablet Take 1 tablet (20 mg total) by mouth daily.  90 tablet  3  . acyclovir (ZOVIRAX) 400 MG tablet Take 1 tablet (400 mg total) by mouth 3 (three) times daily. Take for 7-10 days  30 tablet  1   No current facility-administered medications on file prior to visit.    Allergies  Allergen Reactions  . Doxycycline   . Erythromycin   . Sulfonamide Derivatives     Review of Systems  Review of Systems  Constitutional: Negative for fever and malaise/fatigue.  HENT: Negative for congestion.   Eyes: Negative for discharge.  Respiratory: Negative for shortness of breath.   Cardiovascular: Negative for chest pain, palpitations and leg swelling.  Gastrointestinal: Negative for nausea, abdominal pain and diarrhea.  Genitourinary: Negative for dysuria.  Musculoskeletal: Negative for falls.  Skin: Negative for rash.  Neurological: Negative for loss of consciousness and headaches.  Endo/Heme/Allergies: Negative for polydipsia.  Psychiatric/Behavioral: Negative for depression and suicidal ideas. The patient is not nervous/anxious and does not have insomnia.     Objective  BP 122/84  Pulse 69  Temp(Src) 97.9 F (36.6 C) (Oral)  Ht 5\' 7"  (1.702 m)  Wt 163 lb (73.936 kg)  BMI 25.52 kg/m2  SpO2 97%  LMP 12/19/2012  Physical Exam  Physical Exam  Constitutional: She is oriented to person, place, and time and well-developed, well-nourished, and in no distress. No distress.  HENT:  Head: Normocephalic and atraumatic.  Eyes: Conjunctivae are normal.  Neck: Neck supple. No thyromegaly present.  Cardiovascular: Normal rate, regular rhythm and normal heart sounds.   No murmur heard. Pulmonary/Chest: Effort normal and breath sounds normal. She has no wheezes.  Abdominal: She exhibits no distension and no mass.  Musculoskeletal: She exhibits no edema.   Lymphadenopathy:    She has no cervical adenopathy.  Neurological: She is alert and oriented to person, place, and time.  Skin: Skin is warm and dry. No rash noted. She is not diaphoretic.  Psychiatric: Memory, affect and judgment normal.    Lab Results  Component Value Date   TSH 0.674 12/02/2012   Lab Results  Component Value Date   WBC 5.1 07/25/2012   HGB 12.2 07/25/2012   HCT 36.5 07/25/2012   MCV 86.7 07/25/2012   PLT 210 07/25/2012   Lab Results  Component Value Date   CREATININE 0.78 07/25/2012   BUN 8 07/25/2012   NA 137 07/25/2012   K 4.3 07/25/2012   CL 104 07/25/2012   CO2 25 07/25/2012   Lab Results  Component Value Date   ALT 10 07/25/2012   AST 18 07/25/2012   ALKPHOS 72 07/25/2012   BILITOT 0.5 07/25/2012   Lab Results  Component Value Date   CHOL 156 01/07/2012   Lab Results  Component Value Date   HDL 53.60 01/07/2012   Lab Results  Component Value Date   LDLCALC 94 01/07/2012   Lab Results  Component Value Date   TRIG 40.0 01/07/2012   Lab Results  Component Value Date   CHOLHDL 3 01/07/2012     Assessment & Plan  Overweight (BMI 25.0-29.9) Tolerating Topamax and Phentermine with exercise and diet changes has lost weight, may continue same  Elevated BP Numbers acceptable on recheck, no changes

## 2012-12-25 NOTE — Assessment & Plan Note (Signed)
Numbers acceptable on recheck, no changes

## 2012-12-25 NOTE — Assessment & Plan Note (Signed)
Tolerating Topamax and Phentermine with exercise and diet changes has lost weight, may continue same

## 2012-12-26 ENCOUNTER — Ambulatory Visit: Payer: 59 | Admitting: Family Medicine

## 2012-12-29 ENCOUNTER — Encounter: Payer: Self-pay | Admitting: *Deleted

## 2012-12-29 ENCOUNTER — Encounter: Payer: 59 | Attending: Family Medicine | Admitting: *Deleted

## 2012-12-29 VITALS — Ht 66.5 in | Wt 166.4 lb

## 2012-12-29 DIAGNOSIS — E663 Overweight: Secondary | ICD-10-CM | POA: Insufficient documentation

## 2012-12-29 DIAGNOSIS — Z713 Dietary counseling and surveillance: Secondary | ICD-10-CM | POA: Insufficient documentation

## 2012-12-29 NOTE — Progress Notes (Signed)
  Medical Nutrition Therapy:  Appt start time: 0800 end time:  0900.   Assessment:  Primary concerns today: Lynn Dunlap reports that she is having trouble balancing her weight over the past 2 years or so.  She has always been around 135-140.  She gained up to 170 pounds.  She has been using MyFitness Pal to track her calories. She started phentermine and Topamax about 3 weeks ago and says that helps with her snacking.  She complains of constipation and is going days without a BM.  Her calories are limited to about 1000/day and she has started losing her hair.  Her diet is very limited  MEDICATIONS: see list   DIETARY INTAKE:  Usual eating pattern includes 2 meals and 1-2 snacks per day.  Everyday foods include very lean proteins, vegetables, and fruits.  Avoided foods include all others.    24-hr recall: [phone] kcal/day B ( AM): banana.  Used to have yogurt Snk ( AM): none  L ( PM): salad with oil and vinegar.  Might have tuna with crackers Snk ( PM): apple or rice cakes D ( PM): salmon with broccoli; sometimes spaghetti Snk ( PM): not usually Beverages: water  Usual physical activity: run or walk 3-4 days for 45 minutes  Estimated energy needs: 1500 calories 170 g carbohydrates 112 g protein 42 g fat   Nutritional Diagnosis:  [place]-3.4 Unintentional weight gain As related to mindless eating.  As evidenced by 20 pound weight gain in 2 years.    Intervention:  Nutrition counseling provided.  Encouraged patient to reject traditional diet mentality of "good" vs "bad" foods.  There are no good and bad foods, but rather food is fuel that we needs for our bodies.  When we don't get enough fuel, our bodies suffer the metabolic consequences.  Encouraged patient to eat whatever foods will satisfy them, regardless of their nutritional value.  We will discuss nutritional values of foods at a subsequent appointment.  Encouraged patient to honor their body's internal hunger and fullness cues.   Throughout the day, check in mentally and rate hunger.  Try not to eat when ravenous, but instead when slightly hungry.  Then choose food(s) that will be satisfying regardless of nutritional content.  Sit down to enjoy those foods.  Minimize distractions: turn off tv, put away books, work, Programmer, applications.  Make the meal last at least 20 minutes in order to give time to experience and register satiety.  Stop eating when full regardless of how much food is left on the plate.  Get more if still hungry.  The key is to honor fullness so throughout the meal, rate fullness factor and stop when comfortably full, but not stuffed.  Reminded patient that they can have any food they want, whenever they want, and however much they want.  Eventually the novelty will wear out and each food will be equal in terms of its emotional appeal.  This will be a learning process and some days more food will be eaten, some days less.  The key is to honor hunger and fullness without any feelings of guilt.  Pay attention to what the internal cues are, rather than any external factors.    Monitoring/Evaluation:  Dietary intake, exercise, and body weight in 1 month(s).

## 2013-01-30 ENCOUNTER — Ambulatory Visit: Payer: 59 | Admitting: *Deleted

## 2013-02-20 ENCOUNTER — Ambulatory Visit (INDEPENDENT_AMBULATORY_CARE_PROVIDER_SITE_OTHER): Payer: 59 | Admitting: Medical

## 2013-02-20 DIAGNOSIS — D239 Other benign neoplasm of skin, unspecified: Secondary | ICD-10-CM

## 2013-02-20 NOTE — Progress Notes (Signed)
Subjective: Here for lesion on left upper back.  Lesion has been there unchanged for a long time, years, but recently felt irritated.  She has been scratching the lesion, and it is tender, irritating and she almost scratched it off.  No drainage.   Objective: Gen: wd, wn, nad Skin: right upper back with 2mm round raised well circumscribed papular lesion, somewhat pink/red, no fluctuance, warmth, induration or drainage  Assessment: Encounter Diagnosis  Name Primary?  . Benign neoplasm of skin Yes   Plan: Inflamed skin tag vs inflamed granuloma, benign appearing lesion.  Discussed risks/benefits of procedure.  At her request wants it excised.   Prepped skin in usual sterile fashion.  Used 0.5cc of 1% lidocaine with epinephrine for local anesthesia, used sterile razor blade to shave the lesion.  Uses direct pressure and silver nitrate to achieve hemostasis.  Covered wound with sterile bandage and dressing.  EBL <1cc.  Patient tolerated procedure well.  Discussed wound care, and advised f/u prn.  She declined to have lesion sent to pathology.

## 2013-03-30 ENCOUNTER — Ambulatory Visit: Payer: 59 | Admitting: Family Medicine

## 2013-04-13 ENCOUNTER — Other Ambulatory Visit (INDEPENDENT_AMBULATORY_CARE_PROVIDER_SITE_OTHER): Payer: 59

## 2013-04-13 ENCOUNTER — Other Ambulatory Visit: Payer: Self-pay | Admitting: Medical

## 2013-04-13 DIAGNOSIS — R300 Dysuria: Secondary | ICD-10-CM

## 2013-04-13 DIAGNOSIS — R350 Frequency of micturition: Secondary | ICD-10-CM

## 2013-04-13 LAB — POCT URINALYSIS DIPSTICK
Bilirubin, UA: NEGATIVE
Glucose, UA: NEGATIVE
Ketones, UA: NEGATIVE
NITRITE UA: POSITIVE
UROBILINOGEN UA: NEGATIVE
pH, UA: 7

## 2013-04-13 MED ORDER — CIPROFLOXACIN HCL 500 MG PO TABS: 500.0000 mg | ORAL_TABLET | Freq: Two times a day (BID) | ORAL | Status: DC

## 2013-04-20 ENCOUNTER — Ambulatory Visit: Payer: 59 | Admitting: Family Medicine

## 2013-05-23 ENCOUNTER — Other Ambulatory Visit: Payer: Self-pay | Admitting: Family Medicine

## 2013-05-23 DIAGNOSIS — Z1231 Encounter for screening mammogram for malignant neoplasm of breast: Secondary | ICD-10-CM

## 2013-05-25 ENCOUNTER — Ambulatory Visit: Payer: 59 | Admitting: Family Medicine

## 2013-05-26 ENCOUNTER — Encounter: Payer: Self-pay | Admitting: Medical

## 2013-05-26 ENCOUNTER — Ambulatory Visit (INDEPENDENT_AMBULATORY_CARE_PROVIDER_SITE_OTHER): Payer: 59 | Admitting: Medical

## 2013-05-26 VITALS — BP 106/79 | HR 98 | Temp 97.2°F

## 2013-05-26 DIAGNOSIS — F430 Acute stress reaction: Secondary | ICD-10-CM

## 2013-05-26 DIAGNOSIS — G43109 Migraine with aura, not intractable, without status migrainosus: Secondary | ICD-10-CM

## 2013-05-26 MED ORDER — TOPIRAMATE 50 MG PO TABS: 50.0000 mg | ORAL_TABLET | Freq: Every day | ORAL | Status: DC

## 2013-05-26 MED ORDER — SUMATRIPTAN SUCCINATE 50 MG PO TABS: 50.0000 mg | ORAL_TABLET | ORAL | Status: DC | PRN

## 2013-05-26 NOTE — Progress Notes (Signed)
  Subjective:    Lynn Dunlap is a 43 y.o. female who presents for evaluation of headaches, migraines.  Diagnosed with migraines 6+ years ago.   Mainly started after her mother passed away, related to stress.  Started in the past with terrible headaches, vomiting, have to lie in bed for hours or all day.   Headaches improved in 2009.   In the past few years was on Lexapro which helped with stress.  She weaned herself off the medication last year.   Currently getting migraines every other week.  attributes these to stress again.  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    Has work related stress due to interpersonal issues.  She denies other triggers.  Has used abortive measures include Ibuprofen, Fioricet, Relpax, phenergan when she gets the migraines.  Has some phenergan for prn use as well.  On Topamax 25mg  QHS for prophylaxois.  No other aggravating or relieving factors.   The following portions of the patient's history were reviewed and updated as appropriate: allergies, current medications, past family history, past medical history, past social history, past surgical history and problem list.  Review of Systems Constitutional: -fever, -chills, -sweats, -unexpected weight change ENT: -runny nose, head congestion, sneezing, -ear pain or pressure, -sore throat, -hearing loss, -recent allergy problems, -sinus problems Cardiology:  -chest pain, -palpitations, -edema, - hx/o pulsating artery in temple Respiratory: -cough, -shortness of breath, -wheezing Gastroenterology: -abdominal pain, -nausea, -vomiting, -diarrhea, -constipation Hematology: -bleeding or bruising problems Musculoskeletal: - no stiff neck, -arthralgias, -myalgias, -joint swelling, -back pain Ophthalmology: -vision changes, - vision loss, -double vision Urology: -dysuria, -difficulty urinating, -hematuria,  -urinary frequency, -urgency Neurology: -weakness, -tingling, -numbness, -speech changes, - facial changes, -urinary or bowel incontinence, -LOC, -altered mental status.   Other: No recent head injury or concussion, no recent fasting from meals, no recent change in caffeine use, headaches are not worsened by exertion      Objective:   Physical Exam  BP 106/79  Pulse 98  Temp(Src) 97.2 F (36.2 C) (Oral)  General appearance: alert, no distress, WD/WN HEENT: normocephalic, sclerae anicteric, PERRLA, EOMi, nares patent, no discharge or erythema, pharynx normal Oral cavity: MMM, no lesions Neck: supple, no lymphadenopathy, no thyromegaly, no masses, no bruits Heart: RRR, normal S1, S2, no murmurs Lungs: CTA bilaterally, no wheezes, rhonchi, or rales Extremities: unremarkable Pulses: 2+ symmetric, upper and lower extremities Neurological: alert, oriented x 3, CN2-12 intact, strength normal upper extremities and lower extremities, sensation normal throughout, DTRs 2+ throughout, no cerebellar signs, gait normal Psychiatric: normal affect, behavior normal, pleasant     Assessment:   Encounter Diagnoses  Name Primary?  . Migraine with aura Yes  . Acute stress reaction     Plan:   Advised that headache etiology is most likely migraine with aura, stress induced   Acute therapy recommendations include: prescription for Imitrex she can use prn, or if at work can try Excedrin first.  She has prn use of Phenergan and Fioricet at home.     Preventative/prophylactic measures discussed: limiting caffeine and alcohol, stress reduction, avoiding prolonged periods without eating.   Prophylactic medication options, risk /benefits of medications, and Topamax increased to 50mg  QHS.  Consider going back on Lexapro through [facility].  Follow up: with PCM

## 2013-05-26 NOTE — Patient Instructions (Signed)
  Thank you for giving me the opportunity to serve you today.    Your diagnosis today includes: Encounter Diagnoses  Name Primary?  . Migraine with aura Yes  . Acute stress reaction      Specific recommendations today include:  We increased your Topamax to 50mg  nightly  You may use Imitrex with the earliest onset of migraine.  Take 1 tablet as needed with onset of migraine.  You may repeat this in 2 hours if not improvement.   You can only take 2 of these daily as needed.  If at work and migraine starts, try taking Excedrin migraines first.  When at home, you can use either Imitrex, Fioriciet, Excedrin, or Phenergan as an abortive measure.  Work on stress reduction - consider getting massage periodically, find ways to get some personal relaxation time  Drink plenty of water, don't skip meals, don't drink too much caffeine daily.  Follow up: with your primary care provider.

## 2013-06-26 ENCOUNTER — Ambulatory Visit (HOSPITAL_COMMUNITY): Payer: 59 | Attending: Family Medicine

## 2013-07-20 ENCOUNTER — Ambulatory Visit (HOSPITAL_COMMUNITY): Payer: 59

## 2013-07-27 ENCOUNTER — Ambulatory Visit (HOSPITAL_COMMUNITY)
Admission: RE | Admit: 2013-07-27 | Discharge: 2013-07-27 | Disposition: A | Discharge status: Home / Self Care | Payer: BC Managed Care – PPO | Source: Ambulatory Visit | Attending: Family Medicine | Admitting: Family Medicine

## 2013-07-27 DIAGNOSIS — Z1231 Encounter for screening mammogram for malignant neoplasm of breast: Secondary | ICD-10-CM | POA: Insufficient documentation

## 2013-09-08 ENCOUNTER — Ambulatory Visit (INDEPENDENT_AMBULATORY_CARE_PROVIDER_SITE_OTHER): Payer: BC Managed Care – PPO | Admitting: Medical

## 2013-09-08 ENCOUNTER — Encounter: Payer: Self-pay | Admitting: Medical

## 2013-09-08 VITALS — BP 130/80 | HR 60 | Ht 66.0 in | Wt 170.0 lb

## 2013-09-08 DIAGNOSIS — R030 Elevated blood-pressure reading, without diagnosis of hypertension: Secondary | ICD-10-CM

## 2013-09-08 DIAGNOSIS — E663 Overweight: Secondary | ICD-10-CM

## 2013-09-08 DIAGNOSIS — F411 Generalized anxiety disorder: Secondary | ICD-10-CM

## 2013-09-08 LAB — COMPREHENSIVE METABOLIC PANEL
ALBUMIN: 3.9 g/dL (ref 3.5–5.2)
ALT: 13 U/L (ref 0–35)
AST: 16 U/L (ref 0–37)
Alkaline Phosphatase: 78 U/L (ref 39–117)
BUN: 5 mg/dL — ABNORMAL LOW (ref 6–23)
CHLORIDE: 103 meq/L (ref 96–112)
CO2: 29 mEq/L (ref 19–32)
Calcium: 9 mg/dL (ref 8.4–10.5)
Creat: 0.66 mg/dL (ref 0.50–1.10)
Glucose, Bld: 73 mg/dL (ref 70–99)
POTASSIUM: 4.1 meq/L (ref 3.5–5.3)
Sodium: 137 mEq/L (ref 135–145)
Total Bilirubin: 0.5 mg/dL (ref 0.2–1.2)
Total Protein: 7.4 g/dL (ref 6.0–8.3)

## 2013-09-08 LAB — TSH: TSH: 1.239 u[IU]/mL (ref 0.350–4.500)

## 2013-09-08 LAB — HEMOGLOBIN A1C
Hgb A1c MFr Bld: 4.9 % (ref <5.7)
MEAN PLASMA GLUCOSE: 94 mg/dL (ref <117)

## 2013-09-08 LAB — CBC
HEMATOCRIT: 35.7 % — AB (ref 36.0–46.0)
Hemoglobin: 12 g/dL (ref 12.0–15.0)
MCH: 28.7 pg (ref 26.0–34.0)
MCHC: 33.6 g/dL (ref 30.0–36.0)
MCV: 85.4 fL (ref 78.0–100.0)
PLATELETS: 219 10*3/uL (ref 150–400)
RBC: 4.18 MIL/uL (ref 3.87–5.11)
RDW: 12.8 % (ref 11.5–15.5)
WBC: 4.6 10*3/uL (ref 4.0–10.5)

## 2013-09-08 LAB — T4, FREE: Free T4: 1.29 ng/dL (ref 0.80–1.80)

## 2013-09-08 LAB — LIPID PANEL
Cholesterol: 144 mg/dL (ref 0–200)
HDL: 59 mg/dL (ref >39)
LDL Cholesterol: 76 mg/dL (ref 0–99)
Total CHOL/HDL Ratio: 2.4 Ratio
Triglycerides: 44 mg/dL (ref <150)
VLDL: 9 mg/dL (ref 0–40)

## 2013-09-08 NOTE — Progress Notes (Signed)
   Subjective:   Lynn Dunlap is a 43 y.o. female presenting on 09/08/2013 with concerns.  Would like to restart weight loss medication.  Currently 170lb, goal weight 150lb.  Briefly was put on Phentermine  in 03/2013 while husband was home from working out of town.   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   Exercise - walking on the treadmill, daily for at least 30 minutes sometimes more.  Diet - careful with diet, avoiding sweets and big portions, particularly at work with frequent drug rep meals.   Weakness is [place].  Drinking 64 oz water daily.  Denies side effects of phentermine other than occasional insomnia.  Has migraines, currently on Topamax 50mg  but takes every other day on average compared ot daily.  Forgets to take daily.   Has had elevated BP in the past on occasional doctor's visits.  Has hx/o mildly enlarged thyroid.  Hx/o murmur, but echo several years ago normal, no valve issue.  Has anxiety issues, and her weight contributes to this. No chest pain, edema, DOE, SOB.  No other aggravating or relieving factors.  No other complaint.  Review of Systems ROS as in subjective      Objective:   BP 130/80  Pulse 60  Ht 5\' 6"  (1.676 m)  Wt 170 lb (77.111 kg)  BMI 27.45 kg/m2 Wt Readings from Last 3 Encounters:  09/08/13 170 lb (77.111 kg)  12/29/12 166 lb 6.4 oz (75.479 kg)  12/22/12 163 lb (73.936 kg)   BP Readings from Last 3 Encounters:  09/08/13 130/80  05/26/13 106/79  12/25/12 122/84    General appearance: alert, no distress, WD/WN Neck: supple, no lymphadenopathy, no thyromegaly, no masses Heart: RRR, 2-6/2 brief systolic murmur heard best in left upper sternal border, othewrise normal S2 Lungs: CTA bilaterally, no wheezes, rhonchi, or rales Abdomen: +bs, soft, non tender, non distended, no masses, no  hepatomegaly, no splenomegaly Pulses: 2+ symmetric, upper and lower extremities, normal cap refill Ext: no edema     Assessment: Encounter Diagnoses  Name Primary?  Marland Kitchen Overweight Yes  . Elevated blood pressure reading without diagnosis of hypertension   . Anxiety state, unspecified      Plan: She will check insurance coverage for Qsymia vs Phentermine generic.  Discussed diet, exercise, water intake, sleep, risks/benefits of medication.  If we begin Qsymia, then she will stop her current Topamax.  Otherwise, we can begin Phentermine generic + Topamax she is on currently.   Discussed goal weight, goal weight loss per week/month.   Routine labs today.  Lynn Dunlap was seen today for gi issues.  Diagnoses and associated orders for this visit:  Overweight - Comprehensive metabolic panel - Lipid panel - CBC - T4, Free - Hemoglobin A1c - TSH  Elevated blood pressure reading without diagnosis of hypertension - Comprehensive metabolic panel - Lipid panel - CBC - T4, Free - Hemoglobin A1c - TSH  Anxiety state, unspecified - Comprehensive metabolic panel - Lipid panel - CBC - T4, Free - Hemoglobin A1c - TSH    Return pending labs.

## 2013-09-13 ENCOUNTER — Other Ambulatory Visit: Payer: Self-pay | Admitting: Medical

## 2013-09-13 MED ORDER — PHENTERMINE-TOPIRAMATE ER 3.75-23 MG PO CP24: 1.0000 | ORAL_CAPSULE | ORAL | Status: DC

## 2013-09-13 MED ORDER — PHENTERMINE-TOPIRAMATE ER 7.5-46 MG PO CP24: 1.0000 | ORAL_CAPSULE | ORAL | Status: DC

## 2013-10-27 ENCOUNTER — Other Ambulatory Visit: Payer: Self-pay | Admitting: Medical

## 2013-10-27 MED ORDER — ONDANSETRON HCL 4 MG PO TABS: 4.0000 mg | ORAL_TABLET | Freq: Three times a day (TID) | ORAL | Status: DC | PRN

## 2013-11-30 ENCOUNTER — Encounter: Payer: Self-pay | Admitting: Family Medicine

## 2013-11-30 ENCOUNTER — Ambulatory Visit (INDEPENDENT_AMBULATORY_CARE_PROVIDER_SITE_OTHER): Payer: BC Managed Care – PPO | Admitting: Family Medicine

## 2013-11-30 VITALS — BP 131/89 | HR 77 | Temp 98.6°F | Ht 66.0 in | Wt 169.2 lb

## 2013-11-30 DIAGNOSIS — D6489 Other specified anemias: Secondary | ICD-10-CM

## 2013-11-30 DIAGNOSIS — E663 Overweight: Secondary | ICD-10-CM

## 2013-11-30 DIAGNOSIS — R030 Elevated blood-pressure reading, without diagnosis of hypertension: Secondary | ICD-10-CM

## 2013-11-30 DIAGNOSIS — D709 Neutropenia, unspecified: Secondary | ICD-10-CM

## 2013-11-30 DIAGNOSIS — F3289 Other specified depressive episodes: Secondary | ICD-10-CM

## 2013-11-30 DIAGNOSIS — IMO0001 Reserved for inherently not codable concepts without codable children: Secondary | ICD-10-CM

## 2013-11-30 DIAGNOSIS — F329 Major depressive disorder, single episode, unspecified: Secondary | ICD-10-CM

## 2013-11-30 DIAGNOSIS — Z0000 Encounter for general adult medical examination without abnormal findings: Secondary | ICD-10-CM

## 2013-11-30 DIAGNOSIS — D649 Anemia, unspecified: Secondary | ICD-10-CM

## 2013-11-30 DIAGNOSIS — G43909 Migraine, unspecified, not intractable, without status migrainosus: Secondary | ICD-10-CM

## 2013-11-30 DIAGNOSIS — F32A Depression, unspecified: Secondary | ICD-10-CM

## 2013-11-30 LAB — CBC
HCT: 37.5 % (ref 36.0–46.0)
Hemoglobin: 12.5 g/dL (ref 12.0–15.0)
MCHC: 33.3 g/dL (ref 30.0–36.0)
MCV: 89.2 fl (ref 78.0–100.0)
PLATELETS: 192 10*3/uL (ref 150.0–400.0)
RBC: 4.2 Mil/uL (ref 3.87–5.11)
RDW: 12.5 % (ref 11.5–15.5)
WBC: 6 10*3/uL (ref 4.0–10.5)

## 2013-11-30 MED ORDER — ALPRAZOLAM 0.25 MG PO TABS: ORAL_TABLET | ORAL | Status: DC

## 2013-11-30 NOTE — Patient Instructions (Signed)
Anemia, Nonspecific Anemia is a condition in which the concentration of red blood cells or hemoglobin in the blood is below normal. Hemoglobin is a substance in red blood cells that carries oxygen to the tissues of the body. Anemia results in not enough oxygen reaching these tissues.  CAUSES  Common causes of anemia include:   Excessive bleeding. Bleeding may be internal or external. This includes excessive bleeding from periods (in women) or from the intestine.   Poor nutrition.   Chronic kidney, thyroid, and liver disease.  Bone marrow disorders that decrease red blood cell production.  Cancer and treatments for cancer.  HIV, AIDS, and their treatments.  Spleen problems that increase red blood cell destruction.  Blood disorders.  Excess destruction of red blood cells due to infection, medicines, and autoimmune disorders. SIGNS AND SYMPTOMS   Minor weakness.   Dizziness.   Headache.  Palpitations.   Shortness of breath, especially with exercise.   Paleness.  Cold sensitivity.  Indigestion.  Nausea.  Difficulty sleeping.  Difficulty concentrating. Symptoms may occur suddenly or they may develop slowly.  DIAGNOSIS  Additional blood tests are often needed. These help your health care provider determine the best treatment. Your health care provider will check your stool for blood and look for other causes of blood loss.  TREATMENT  Treatment varies depending on the cause of the anemia. Treatment can include:   Supplements of iron, vitamin B12, or folic acid.   Hormone medicines.   A blood transfusion. This may be needed if blood loss is severe.   Hospitalization. This may be needed if there is significant continual blood loss.   Dietary changes.  Spleen removal. HOME CARE INSTRUCTIONS Keep all follow-up appointments. It often takes many weeks to correct anemia, and having your health care provider check on your condition and your response to  treatment is very important. SEEK IMMEDIATE MEDICAL CARE IF:   You develop extreme weakness, shortness of breath, or chest pain.   You become dizzy or have trouble concentrating.  You develop heavy vaginal bleeding.   You develop a rash.   You have bloody or black, tarry stools.   You faint.   You vomit up blood.   You vomit repeatedly.   You have abdominal pain.  You have a fever or persistent symptoms for more than 2-3 days.   You have a fever and your symptoms suddenly get worse.   You are dehydrated.  MAKE SURE YOU:  Understand these instructions.  Will watch your condition.  Will get help right away if you are not doing well or get worse. Document Released: 04/16/2004 Document Revised: 11/09/2012 Document Reviewed: 09/02/2012 [employer] Patient Information 2015 [employer]. This information is not intended to replace advice given to you by your health care provider. Make sure you discuss any questions you have with your health care provider.  

## 2013-11-30 NOTE — Progress Notes (Signed)
Patient ID: Lynn Dunlap, female   DOB: 01-03-1971, 43 y.o.   MRN: [id number] Lynn Dunlap Lynn Dunlap Lynn Dunlap      Progress Note-Follow Up  Subjective  Chief Complaint  Chief Complaint  Patient presents with  . Annual Exam    physical    HPI  Patient is a 43 year old female in today for routine medical care. She is generally doing well. Reports she would benefit work. She has an having less frequent migraines but as of late has developed some ocular symptoms with episodes of blurry vision at times. No recent trauma or other neurologic complaints. No other recent illness. Denies CP/palp/SOB/congestion/fevers/GI or GU c/o. Taking meds as prescribed  Past Medical History  Diagnosis Date  . Depression 08/10/2010  . Preventative health care 01/02/2011  . Headache(784.0)   . Anxiety   . Anemia 01/17/2012  . Neutropenia 01/17/2012  . Cervical cancer screening 01/02/2011    Menarche at 68 G2P2 s/p 1 svd and 1 c/s, tubal No abnl paps or MGMs   . Overweight (BMI 25.0-29.9) 12/02/2012  . Elevated BP 12/02/2012    Past Surgical History  Procedure Laterality Date  . Cesarean section      X 1 w/ tubal  . Tubal ligation      Family History  Problem Relation Age of Onset  . Cancer Mother     sarcoma, gyn cancer w/ hysterectomy  . Hypertension Father   . Asthma Brother   . Cancer Maternal Grandfather     prostate  . Hyperlipidemia Paternal Grandmother   . Glaucoma Paternal Grandfather     History   Social History  . Marital Status: Married    Spouse Name: N/A    Number of Children: N/A  . Years of Education: N/A   Occupational History  . Not on file.   Social History Main Topics  . Smoking status: Never Smoker   . Smokeless tobacco: Never Used  . Alcohol Use: No  . Drug Use: No  . Sexual Activity: Yes    Partners: Male   Other Topics Concern  . Not on file   Social History Narrative  . No narrative on file    Current Outpatient Prescriptions  on File Prior to Visit  Medication Sig Dispense Refill  . SUMAtriptan (IMITREX) 50 MG tablet Take 1 tablet (50 mg total) by mouth every 2 (two) hours as needed for migraine or headache. May repeat in 2 hours if headache persists or recurs.  10 tablet  0  . topiramate (TOPAMAX) 50 MG tablet Take 1 tablet (50 mg total) by mouth daily.  30 tablet  1   No current facility-administered medications on file prior to visit.    Allergies  Allergen Reactions  . Doxycycline   . Erythromycin   . Sulfonamide Derivatives     Review of Systems  Review of Systems  Constitutional: Negative for fever, chills and malaise/fatigue.  HENT: Negative for congestion, hearing loss and nosebleeds.   Eyes: Positive for blurred vision. Negative for double vision, photophobia and discharge.  Respiratory: Negative for cough, sputum production, shortness of breath and wheezing.   Cardiovascular: Negative for chest pain, palpitations and leg swelling.  Gastrointestinal: Negative for heartburn, nausea, vomiting, abdominal pain, diarrhea, constipation and blood in stool.  Genitourinary: Negative for dysuria, urgency, frequency and hematuria.  Musculoskeletal: Negative for back pain, falls and myalgias.  Skin: Negative for rash.  Neurological: Positive for headaches. Negative for dizziness, tremors, sensory change, focal  weakness, loss of consciousness and weakness.  Endo/Heme/Allergies: Negative for polydipsia. Does not bruise/bleed easily.  Psychiatric/Behavioral: Negative for depression and suicidal ideas. The patient is not nervous/anxious and does not have insomnia.     Objective  BP 131/89  Pulse 77  Temp(Src) 98.6 F (37 C) (Oral)  Ht 5\' 6"  (1.676 m)  Wt 169 lb 3.2 oz (76.749 kg)  BMI 27.32 kg/m2  SpO2 100%  LMP 11/09/2013  Physical Exam  Physical Exam  Constitutional: She is oriented to person, place, and time and well-developed, well-nourished, and in no distress. No distress.  HENT:  Head:  Normocephalic and atraumatic.  Right Ear: External ear normal.  Left Ear: External ear normal.  Nose: Nose normal.  Mouth/Throat: Oropharynx is clear and moist. No oropharyngeal exudate.  Eyes: Conjunctivae are normal. Pupils are equal, round, and reactive to light. Right eye exhibits no discharge. Left eye exhibits no discharge. No scleral icterus.  Neck: Normal range of motion. Neck supple. No thyromegaly present.  Cardiovascular: Normal rate, regular rhythm, normal heart sounds and intact distal pulses.   No murmur heard. Pulmonary/Chest: Effort normal and breath sounds normal. No respiratory distress. She has no wheezes. She has no rales.  Abdominal: Soft. Bowel sounds are normal. She exhibits no distension and no mass. There is no tenderness.  Musculoskeletal: Normal range of motion. She exhibits no edema and no tenderness.  Lymphadenopathy:    She has no cervical adenopathy.  Neurological: She is alert and oriented to person, place, and time. She has normal reflexes. No cranial nerve deficit. Coordination normal.  Skin: Skin is warm and dry. No rash noted. She is not diaphoretic.  Psychiatric: Mood, memory and affect normal.    Lab Results  Component Value Date   TSH 1.239 09/08/2013   Lab Results  Component Value Date   WBC 4.6 09/08/2013   HGB 12.0 09/08/2013   HCT 35.7* 09/08/2013   MCV 85.4 09/08/2013   PLT 219 09/08/2013   Lab Results  Component Value Date   CREATININE 0.66 09/08/2013   BUN 5* 09/08/2013   NA 137 09/08/2013   K 4.1 09/08/2013   CL 103 09/08/2013   CO2 29 09/08/2013   Lab Results  Component Value Date   ALT 13 09/08/2013   AST 16 09/08/2013   ALKPHOS 78 09/08/2013   BILITOT 0.5 09/08/2013   Lab Results  Component Value Date   CHOL 144 09/08/2013   Lab Results  Component Value Date   HDL 59 09/08/2013   Lab Results  Component Value Date   LDLCALC 76 09/08/2013   Lab Results  Component Value Date   TRIG 44 09/08/2013   Lab Results  Component Value  Date   CHOLHDL 2.4 09/08/2013     Assessment & Plan  Anemia Mild previously now resolved  Elevated BP Well controlled. Encouraged heart healthy diet such as the DASH diet and exercise as tolerated.   Neutropenia resolved  Overweight (BMI 25.0-29.9) Encouraged DASH diet, decrease po intake and increase exercise as tolerated. Needs 7-8 hours of sleep nightly. Avoid trans fats, eat small, frequent meals every 4-5 hours with lean proteins, complex carbs and healthy fats. Minimize simple carbs.  Preventative health care Patient encouraged to maintain heart healthy diet, regular exercise, adequate sleep. Consider daily probiotics. Take medications as prescribed. Gets flu shot at work  MIGRAINE HEADACHE Improved frequency on Topamax although she has had some ocular symptoms recenlty, will follow up with opthamology  Depression with anxiety Doing well  with only occasional Alprazolam use

## 2013-11-30 NOTE — Progress Notes (Signed)
Pre visit review using our clinic review tool, if applicable. No additional management support is needed unless otherwise documented below in the visit note. 

## 2013-12-03 ENCOUNTER — Encounter: Payer: Self-pay | Admitting: Family Medicine

## 2013-12-03 NOTE — Assessment & Plan Note (Signed)
Improved frequency on Topamax although she has had some ocular symptoms recenlty, will follow up with opthamology

## 2013-12-03 NOTE — Assessment & Plan Note (Signed)
Doing well with only occasional Alprazolam use

## 2013-12-03 NOTE — Assessment & Plan Note (Signed)
Well controlled. Encouraged heart healthy diet such as the DASH diet and exercise as tolerated.  

## 2013-12-03 NOTE — Assessment & Plan Note (Signed)
Mild previously now resolved

## 2013-12-03 NOTE — Assessment & Plan Note (Signed)
Encouraged DASH diet, decrease po intake and increase exercise as tolerated. Needs 7-8 hours of sleep nightly. Avoid trans fats, eat small, frequent meals every 4-5 hours with lean proteins, complex carbs and healthy fats. Minimize simple carbs 

## 2013-12-03 NOTE — Assessment & Plan Note (Signed)
resolved 

## 2013-12-03 NOTE — Assessment & Plan Note (Addendum)
Patient encouraged to maintain heart healthy diet, regular exercise, adequate sleep. Consider daily probiotics. Take medications as prescribed. Gets flu shot at work

## 2014-01-01 ENCOUNTER — Other Ambulatory Visit: Payer: Self-pay | Admitting: Medical

## 2014-04-03 ENCOUNTER — Other Ambulatory Visit: Payer: Self-pay | Admitting: Medical

## 2014-05-31 ENCOUNTER — Ambulatory Visit: Payer: BC Managed Care – PPO | Admitting: Family Medicine

## 2014-06-07 ENCOUNTER — Ambulatory Visit (INDEPENDENT_AMBULATORY_CARE_PROVIDER_SITE_OTHER): Payer: 59 | Admitting: Family Medicine

## 2014-06-07 ENCOUNTER — Encounter: Payer: Self-pay | Admitting: Family Medicine

## 2014-06-07 VITALS — BP 138/90 | HR 78 | Temp 97.7°F | Ht 66.5 in | Wt 163.0 lb

## 2014-06-07 DIAGNOSIS — R030 Elevated blood-pressure reading, without diagnosis of hypertension: Secondary | ICD-10-CM | POA: Diagnosis not present

## 2014-06-07 DIAGNOSIS — F418 Other specified anxiety disorders: Secondary | ICD-10-CM | POA: Diagnosis not present

## 2014-06-07 DIAGNOSIS — E663 Overweight: Secondary | ICD-10-CM

## 2014-06-07 DIAGNOSIS — IMO0001 Reserved for inherently not codable concepts without codable children: Secondary | ICD-10-CM

## 2014-06-07 DIAGNOSIS — G43809 Other migraine, not intractable, without status migrainosus: Secondary | ICD-10-CM | POA: Diagnosis not present

## 2014-06-07 MED ORDER — BUTALBITAL-APAP-CAFFEINE 50-325-40 MG PO TABS: 1.0000 | ORAL_TABLET | Freq: Four times a day (QID) | ORAL | Status: AC | PRN

## 2014-06-07 NOTE — Progress Notes (Signed)
Pre visit review using our clinic review tool, if applicable. No additional management support is needed unless otherwise documented below in the visit note. 

## 2014-06-07 NOTE — Patient Instructions (Signed)

## 2014-06-18 NOTE — Assessment & Plan Note (Signed)
Doing well. Continue Alprazolam prn using infrequently

## 2014-06-18 NOTE — Progress Notes (Signed)
Lynn Dunlap  [id number] 1970/07/03 06/18/2014      Progress Note-Follow Up  Subjective  Chief Complaint  Chief Complaint  Patient presents with  . Follow-up    HPI  Patient is a 44 y.o. female in today for routine medical care. Patient in today doing well. Continues to have headaches but they are much less frequent. She does note that [facility] is helpful when she needs it. Has some photophobia and nausea with a bad headache  But no phonophobia or vomiting. Has only had one in past couple of months. Otherwise doing well. Denies CP/palp/SOB/HA/congestion/fevers/GI or GU c/o. Taking meds as prescribed  Past Medical History  Diagnosis Date  . Depression 08/10/2010  . Preventative health care 01/02/2011  . Headache(784.0)   . Anxiety   . Anemia 01/17/2012  . Neutropenia 01/17/2012  . Cervical cancer screening 01/02/2011    Menarche at 59 G2P2 s/p 1 svd and 1 c/s, tubal No abnl paps or MGMs   . Overweight (BMI 25.0-29.9) 12/02/2012  . Elevated BP 12/02/2012  . Depression with anxiety 08/10/2010    Past Surgical History  Procedure Laterality Date  . Cesarean section      X 1 w/ tubal  . Tubal ligation      Family History  Problem Relation Age of Onset  . Cancer Mother     sarcoma, gyn cancer w/ hysterectomy  . Hypertension Father   . Asthma Brother   . Cancer Maternal Grandfather     prostate  . Hyperlipidemia Paternal Grandmother   . Glaucoma Paternal Grandfather     History   Social History  . Marital Status: Married    Spouse Name: N/A  . Number of Children: N/A  . Years of Education: N/A   Occupational History  . Not on file.   Social History Main Topics  . Smoking status: Never Smoker   . Smokeless tobacco: Never Used  . Alcohol Use: No  . Drug Use: No  . Sexual Activity:    Partners: Male     Comment: lives with husband and children, no dietary restrictions   Other Topics Concern  . Not on file   Social History Narrative    Current  Outpatient Prescriptions on File Prior to Visit  Medication Sig Dispense Refill  . ALPRAZolam (XANAX) 0.25 MG tablet 1/2 to 1 tab po q 12 hour prn anxiety 30 tablet 3   No current facility-administered medications on file prior to visit.    Allergies  Allergen Reactions  . Doxycycline   . Erythromycin   . Sulfonamide Derivatives     Review of Systems  Review of Systems  Constitutional: Negative for fever and malaise/fatigue.  HENT: Negative for congestion.   Eyes: Negative for discharge.  Respiratory: Negative for shortness of breath.   Cardiovascular: Negative for chest pain, palpitations and leg swelling.  Gastrointestinal: Negative for nausea, abdominal pain and diarrhea.  Genitourinary: Negative for dysuria.  Musculoskeletal: Negative for falls.  Skin: Negative for rash.  Neurological: Negative for loss of consciousness.  Endo/Heme/Allergies: Negative for polydipsia.  Psychiatric/Behavioral: Negative for depression and suicidal ideas. The patient is nervous/anxious. The patient does not have insomnia.     Objective  BP 138/90 mmHg  Pulse 78  Temp(Src) 97.7 F (36.5 C) (Oral)  Ht 5' 6.5" (1.689 m)  Wt 163 lb (73.936 kg)  BMI 25.92 kg/m2  SpO2 99%  LMP 05/28/2014  Physical Exam  Physical Exam  Constitutional: She is oriented to person, place,  and time and well-developed, well-nourished, and in no distress. No distress.  HENT:  Head: Normocephalic and atraumatic.  Eyes: Conjunctivae are normal.  Neck: Neck supple. No thyromegaly present.  Cardiovascular: Normal rate, regular rhythm and normal heart sounds.   No murmur heard. Pulmonary/Chest: Effort normal and breath sounds normal. She has no wheezes.  Abdominal: She exhibits no distension and no mass.  Musculoskeletal: She exhibits no edema.  Lymphadenopathy:    She has no cervical adenopathy.  Neurological: She is alert and oriented to person, place, and time.  Skin: Skin is warm and dry. No rash noted.  She is not diaphoretic.  Psychiatric: Memory, affect and judgment normal.    Lab Results  Component Value Date   TSH 1.239 09/08/2013   Lab Results  Component Value Date   WBC 6.0 11/30/2013   HGB 12.5 11/30/2013   HCT 37.5 11/30/2013   MCV 89.2 11/30/2013   PLT 192.0 11/30/2013   Lab Results  Component Value Date   CREATININE 0.66 09/08/2013   BUN 5* 09/08/2013   NA 137 09/08/2013   K 4.1 09/08/2013   CL 103 09/08/2013   CO2 29 09/08/2013   Lab Results  Component Value Date   ALT 13 09/08/2013   AST 16 09/08/2013   ALKPHOS 78 09/08/2013   BILITOT 0.5 09/08/2013   Lab Results  Component Value Date   CHOL 144 09/08/2013   Lab Results  Component Value Date   HDL 59 09/08/2013   Lab Results  Component Value Date   LDLCALC 76 09/08/2013   Lab Results  Component Value Date   TRIG 44 09/08/2013   Lab Results  Component Value Date   CHOLHDL 2.4 09/08/2013     Assessment & Plan  Migraine Encouraged increased hydration, 64 ounces of clear fluids daily. Minimize alcohol and caffeine. Eat small frequent meals with lean proteins and complex carbs. Avoid high and low blood sugars. Get adequate sleep, 7-8 hours a night. Needs exercise daily preferably in the morning.   Elevated BP Improved on recheck. No changes.   Depression with anxiety Doing well. Continue Alprazolam prn using infrequently   Overweight (BMI 25.0-29.9) Encouraged DASH diet, decrease po intake and increase exercise as tolerated. Needs 7-8 hours of sleep nightly. Avoid trans fats, eat small, frequent meals every 4-5 hours with lean proteins, complex carbs and healthy fats. Minimize simple carbs, GMO foods.

## 2014-06-18 NOTE — Assessment & Plan Note (Signed)
Improved on recheck. No changes 

## 2014-06-18 NOTE — Assessment & Plan Note (Signed)
Encouraged increased hydration, 64 ounces of clear fluids daily. Minimize alcohol and caffeine. Eat small frequent meals with lean proteins and complex carbs. Avoid high and low blood sugars. Get adequate sleep, 7-8 hours a night. Needs exercise daily preferably in the morning.  

## 2014-06-18 NOTE — Assessment & Plan Note (Signed)
Encouraged DASH diet, decrease po intake and increase exercise as tolerated. Needs 7-8 hours of sleep nightly. Avoid trans fats, eat small, frequent meals every 4-5 hours with lean proteins, complex carbs and healthy fats. Minimize simple carbs, GMO foods. 

## 2014-07-06 ENCOUNTER — Other Ambulatory Visit: Payer: Self-pay | Admitting: Family Medicine

## 2014-07-06 NOTE — Telephone Encounter (Signed)
Faxed hardcopy for Alprazolam to [facility]

## 2014-10-22 ENCOUNTER — Encounter: Payer: Self-pay | Admitting: Family Medicine

## 2014-10-22 ENCOUNTER — Ambulatory Visit (INDEPENDENT_AMBULATORY_CARE_PROVIDER_SITE_OTHER): Payer: 59 | Admitting: Family Medicine

## 2014-10-22 VITALS — BP 149/83 | HR 75 | Temp 97.9°F | Ht 66.5 in | Wt 174.0 lb

## 2014-10-22 DIAGNOSIS — G43809 Other migraine, not intractable, without status migrainosus: Secondary | ICD-10-CM | POA: Diagnosis not present

## 2014-10-22 MED ORDER — SUMATRIPTAN SUCCINATE 50 MG PO TABS: 50.0000 mg | ORAL_TABLET | Freq: Once | ORAL | Status: AC

## 2014-10-22 NOTE — Patient Instructions (Signed)
It was a pleasure seeing you today. I am sorry you have a migraine.  I will call in Imitrex for you, take as directed.  If you have further issues or migrain does not resolve please be seen immediately.  If your migraines worsen over time, please speak with your PCP about daily therapy.   Migraine Headache A migraine headache is an intense, throbbing pain on one or both sides of your head. A migraine can last for 30 minutes to several hours. CAUSES  The exact cause of a migraine headache is not always known. However, a migraine may be caused when nerves in the brain become irritated and release chemicals that cause inflammation. This causes pain. Certain things may also trigger migraines, such as:  Alcohol.  Smoking.  Stress.  Menstruation.  Aged cheeses.  Foods or drinks that contain nitrates, glutamate, aspartame, or tyramine.  Lack of sleep.  Chocolate.  Caffeine.  Hunger.  Physical exertion.  Fatigue.  Medicines used to treat chest pain (nitroglycerine), birth control pills, estrogen, and some blood pressure medicines. SIGNS AND SYMPTOMS  Pain on one or both sides of your head.  Pulsating or throbbing pain.  Severe pain that prevents daily activities.  Pain that is aggravated by any physical activity.  Nausea, vomiting, or both.  Dizziness.  Pain with exposure to bright lights, loud noises, or activity.  General sensitivity to bright lights, loud noises, or smells. Before you get a migraine, you may get warning signs that a migraine is coming (aura). An aura may include:  Seeing flashing lights.  Seeing bright spots, halos, or zigzag lines.  Having tunnel vision or blurred vision.  Having feelings of numbness or tingling.  Having trouble talking.  Having muscle weakness. DIAGNOSIS  A migraine headache is often diagnosed based on:  Symptoms.  Physical exam.  A CT scan or MRI of your head. These imaging tests cannot diagnose migraines, but  they can help rule out other causes of headaches. TREATMENT Medicines may be given for pain and nausea. Medicines can also be given to help prevent recurrent migraines.  HOME CARE INSTRUCTIONS  Only take over-the-counter or prescription medicines for pain or discomfort as directed by your health care provider. The use of long-term narcotics is not recommended.  Lie down in a dark, quiet room when you have a migraine.  Keep a journal to find out what may trigger your migraine headaches. For example, write down:  What you eat and drink.  How much sleep you get.  Any change to your diet or medicines.  Limit alcohol consumption.  Quit smoking if you smoke.  Get 7-9 hours of sleep, or as recommended by your health care provider.  Limit stress.  Keep lights dim if bright lights bother you and make your migraines worse. SEEK IMMEDIATE MEDICAL CARE IF:   Your migraine becomes severe.  You have a fever.  You have a stiff neck.  You have vision loss.  You have muscular weakness or loss of muscle control.  You start losing your balance or have trouble walking.  You feel faint or pass out.  You have severe symptoms that are different from your first symptoms. MAKE SURE YOU:   Understand these instructions.  Will watch your condition.  Will get help right away if you are not doing well or get worse. Document Released: 03/09/2005 Document Revised: 07/24/2013 Document Reviewed: 11/14/2012 [facility] Patient Information 2015 [place], [place]. This information is not intended to replace advice given to you by  your health care provider. Make sure you discuss any questions you have with your health care provider.  

## 2014-10-22 NOTE — Progress Notes (Signed)
   Subjective:    Patient ID: Lynn Dunlap, female    DOB: [birth year], 44 y.o.   MRN: [id number]  HPI  Migraine: Patient presents for office visit secondary to migraine headache of 2 days' duration. Patient describes her migraine as right sided headache above the eyes over the temporal region. She denies any visual changes, photophobia or vomiting. She endorses photophobia and nausea. Patient states that she has had migraines over the course of a few years intermittently. She has had a trial of Topamax, which she did not want to take a daily medication at that time. Patient states that she has migraine headaches 1-2 times a month. She has noticed that stress is her trigger. She is currently going through a stressful situation with changing jobs. She took a Fioricet Approximately 5 hours ago, which helped mildly with her migraine, but it did not resolve. Patient states she has had Imitrex in the past which has always been helpful, but she did not have any left in her prescription.   Never smoker Past Medical History  Diagnosis Date  . Depression 08/10/2010  . Preventative health care 01/02/2011  . Headache(784.0)   . Anxiety   . Anemia 01/17/2012  . Neutropenia 01/17/2012  . Cervical cancer screening 01/02/2011    Menarche at 65 G2P2 s/p 1 svd and 1 c/s, tubal No abnl paps or MGMs   . Overweight (BMI 25.0-29.9) 12/02/2012  . Elevated BP 12/02/2012  . Depression with anxiety 08/10/2010   Allergies  Allergen Reactions  . Doxycycline   . Erythromycin   . Sulfonamide Derivatives      Review of Systems Per HPI, otherwise negative    Objective:   Physical Exam BP 149/83 mmHg  Pulse 75  Temp(Src) 97.9 F (36.6 C) (Oral)  Ht 5' 6.5" (1.689 m)  Wt 174 lb (78.926 kg)  BMI 27.67 kg/m2  SpO2 97% Gen: NAD. Nontoxic in appearance, appears uncomfortable. Well-developed, well-nourished. HEENT: AT. [place]. Bilateral TM visualized and normal in appearance. Bilateral eyes without injections or  icterus. MMM. CV: RRR, 1/6 systolic murmur appreciated. Neuro:  Normal gait. PERLA. EOMi. Alert. Oriented. Cranial nerves II through XII intact, muscle strength 5/5 upper and lower extremity.      Assessment & Plan:  Lynn Dunlap is a 44 y.o. AAF present with Migraine headache of 2 days' duration. - Patient encouraged to rest, Imitrex prescription refill today. - Discussed preventative therapy today, patient would like to wait at this time since she is only having 1-2 headaches a month. We did discuss there are other options besides the Topamax if she would like to try that she needs to make an appointment with her PCP for discussion. - Follow-up with her PCP in 2 weeks, or sooner if needed. - Imitrex prescribed.

## 2014-10-23 NOTE — Assessment & Plan Note (Signed)
Lynn Dunlap is a 44 y.o. AAF present with Migraine headache of 2 days' duration. - Patient encouraged to rest, Imitrex prescription refill today. - Discussed preventative therapy today, patient would like to wait at this time since she is only having 1-2 headaches a month. We did discuss there are other options besides the Topamax if she would like to try that she needs to make an appointment with her PCP for discussion. - Follow-up with her PCP in 2 weeks, or sooner if needed. - Imitrex prescribed.

## 2014-11-27 ENCOUNTER — Encounter: Payer: Self-pay | Admitting: *Deleted

## 2014-12-05 ENCOUNTER — Telehealth: Payer: Self-pay | Admitting: Behavioral Health

## 2014-12-05 NOTE — Telephone Encounter (Signed)
Unable to reach patient at time of Pre-Visit Call.  Left message for patient to return call when available.    

## 2014-12-06 ENCOUNTER — Ambulatory Visit (INDEPENDENT_AMBULATORY_CARE_PROVIDER_SITE_OTHER): Payer: 59 | Admitting: Family Medicine

## 2014-12-06 ENCOUNTER — Encounter: Payer: Self-pay | Admitting: Family Medicine

## 2014-12-06 VITALS — BP 135/83 | HR 83 | Temp 98.1°F | Ht 66.5 in | Wt 171.4 lb

## 2014-12-06 DIAGNOSIS — R030 Elevated blood-pressure reading, without diagnosis of hypertension: Secondary | ICD-10-CM | POA: Diagnosis not present

## 2014-12-06 DIAGNOSIS — F418 Other specified anxiety disorders: Secondary | ICD-10-CM | POA: Diagnosis not present

## 2014-12-06 DIAGNOSIS — G43809 Other migraine, not intractable, without status migrainosus: Secondary | ICD-10-CM

## 2014-12-06 DIAGNOSIS — Z0000 Encounter for general adult medical examination without abnormal findings: Secondary | ICD-10-CM | POA: Diagnosis not present

## 2014-12-06 DIAGNOSIS — E663 Overweight: Secondary | ICD-10-CM | POA: Diagnosis not present

## 2014-12-06 DIAGNOSIS — IMO0001 Reserved for inherently not codable concepts without codable children: Secondary | ICD-10-CM

## 2014-12-06 LAB — CBC
HEMATOCRIT: 37.6 % (ref 36.0–46.0)
HEMOGLOBIN: 12.6 g/dL (ref 12.0–15.0)
MCHC: 33.4 g/dL (ref 30.0–36.0)
MCV: 88.3 fl (ref 78.0–100.0)
Platelets: 208 10*3/uL (ref 150.0–400.0)
RBC: 4.26 Mil/uL (ref 3.87–5.11)
RDW: 13.1 % (ref 11.5–15.5)
WBC: 6 10*3/uL (ref 4.0–10.5)

## 2014-12-06 LAB — TSH: TSH: 0.88 u[IU]/mL (ref 0.35–4.50)

## 2014-12-06 LAB — COMPREHENSIVE METABOLIC PANEL
ALK PHOS: 88 U/L (ref 39–117)
ALT: 28 U/L (ref 0–35)
AST: 28 U/L (ref 0–37)
Albumin: 4 g/dL (ref 3.5–5.2)
BUN: 8 mg/dL (ref 6–23)
CO2: 29 mEq/L (ref 19–32)
Calcium: 9.5 mg/dL (ref 8.4–10.5)
Chloride: 105 mEq/L (ref 96–112)
Creatinine, Ser: 0.73 mg/dL (ref 0.40–1.20)
GFR: 110.81 mL/min (ref >60.00)
Glucose, Bld: 78 mg/dL (ref 70–99)
Potassium: 4.1 mEq/L (ref 3.5–5.1)
Sodium: 139 mEq/L (ref 135–145)
TOTAL PROTEIN: 7.9 g/dL (ref 6.0–8.3)
Total Bilirubin: 0.6 mg/dL (ref 0.2–1.2)

## 2014-12-06 LAB — LIPID PANEL
CHOL/HDL RATIO: 3
Cholesterol: 159 mg/dL (ref 0–200)
HDL: 61.8 mg/dL (ref >39.00)
LDL Cholesterol: 88 mg/dL (ref 0–99)
NONHDL: 97.47
Triglycerides: 45 mg/dL (ref 0.0–149.0)
VLDL: 9 mg/dL (ref 0.0–40.0)

## 2014-12-06 MED ORDER — ALPRAZOLAM 0.25 MG PO TABS: 0.2500 mg | ORAL_TABLET | Freq: Two times a day (BID) | ORAL | Status: DC | PRN

## 2014-12-06 NOTE — Assessment & Plan Note (Signed)
Encouraged DASH diet, decrease po intake and increase exercise as tolerated. Needs 7-8 hours of sleep nightly. Avoid trans fats, eat small, frequent meals every 4-5 hours with lean proteins, complex carbs and healthy fats. Minimize simple carbs 

## 2014-12-06 NOTE — Progress Notes (Signed)
Pre visit review using our clinic review tool, if applicable. No additional management support is needed unless otherwise documented below in the visit note. 

## 2014-12-06 NOTE — Patient Instructions (Signed)
Preventive Care for Adults A healthy lifestyle and preventive care can promote health and wellness. Preventive health guidelines for women include the following key practices.  A routine yearly physical is a good way to check with your health care provider about your health and preventive screening. It is a chance to share any concerns and updates on your health and to receive a thorough exam.  Visit your dentist for a routine exam and preventive care every 6 months. Brush your teeth twice a day and floss once a day. Good oral hygiene prevents tooth decay and gum disease.  The frequency of eye exams is based on your age, health, family medical history, use of contact lenses, and other factors. Follow your health care provider's recommendations for frequency of eye exams.  Eat a healthy diet. Foods like vegetables, fruits, whole grains, low-fat dairy products, and lean protein foods contain the nutrients you need without too many calories. Decrease your intake of foods high in solid fats, added sugars, and salt. Eat the right amount of calories for you.Get information about a proper diet from your health care provider, if necessary.  Regular physical exercise is one of the most important things you can do for your health. Most adults should get at least 150 minutes of moderate-intensity exercise (any activity that increases your heart rate and causes you to sweat) each week. In addition, most adults need muscle-strengthening exercises on 2 or more days a week.  Maintain a healthy weight. The body mass index (BMI) is a screening tool to identify possible weight problems. It provides an estimate of body fat based on height and weight. Your health care provider can find your BMI and can help you achieve or maintain a healthy weight.For adults 20 years and older:  A BMI below 18.5 is considered underweight.  A BMI of 18.5 to 24.9 is normal.  A BMI of 25 to 29.9 is considered overweight.  A BMI of  30 and above is considered obese.  Maintain normal blood lipids and cholesterol levels by exercising and minimizing your intake of saturated fat. Eat a balanced diet with plenty of fruit and vegetables. Blood tests for lipids and cholesterol should begin at age 76 and be repeated every 5 years. If your lipid or cholesterol levels are high, you are over 50, or you are at high risk for heart disease, you may need your cholesterol levels checked more frequently.Ongoing high lipid and cholesterol levels should be treated with medicines if diet and exercise are not working.  If you smoke, find out from your health care provider how to quit. If you do not use tobacco, do not start.  Lung cancer screening is recommended for adults aged 22-80 years who are at high risk for developing lung cancer because of a history of smoking. A yearly low-dose CT scan of the lungs is recommended for people who have at least a 30-pack-year history of smoking and are a current smoker or have quit within the past 15 years. A pack year of smoking is smoking an average of 1 pack of cigarettes a day for 1 year (for example: 1 pack a day for 30 years or 2 packs a day for 15 years). Yearly screening should continue until the smoker has stopped smoking for at least 15 years. Yearly screening should be stopped for people who develop a health problem that would prevent them from having lung cancer treatment.  If you are pregnant, do not drink alcohol. If you are breastfeeding,  be very cautious about drinking alcohol. If you are not pregnant and choose to drink alcohol, do not have more than 1 drink per day. One drink is considered to be 12 ounces (355 mL) of beer, 5 ounces (148 mL) of wine, or 1.5 ounces (44 mL) of liquor.  Avoid use of street drugs. Do not share needles with anyone. Ask for help if you need support or instructions about stopping the use of drugs.  High blood pressure causes heart disease and increases the risk of  stroke. Your blood pressure should be checked at least every 1 to 2 years. Ongoing high blood pressure should be treated with medicines if weight loss and exercise do not work.  If you are 75-52 years old, ask your health care provider if you should take aspirin to prevent strokes.  Diabetes screening involves taking a blood sample to check your fasting blood sugar level. This should be done once every 3 years, after age 15, if you are within normal weight and without risk factors for diabetes. Testing should be considered at a younger age or be carried out more frequently if you are overweight and have at least 1 risk factor for diabetes.  Breast cancer screening is essential preventive care for women. You should practice "breast self-awareness." This means understanding the normal appearance and feel of your breasts and may include breast self-examination. Any changes detected, no matter how small, should be reported to a health care provider. Women in their 58s and 30s should have a clinical breast exam (CBE) by a health care provider as part of a regular health exam every 1 to 3 years. After age 16, women should have a CBE every year. Starting at age 53, women should consider having a mammogram (breast X-ray test) every year. Women who have a family history of breast cancer should talk to their health care provider about genetic screening. Women at a high risk of breast cancer should talk to their health care providers about having an MRI and a mammogram every year.  Breast cancer gene (BRCA)-related cancer risk assessment is recommended for women who have family members with BRCA-related cancers. BRCA-related cancers include breast, ovarian, tubal, and peritoneal cancers. Having family members with these cancers may be associated with an increased risk for harmful changes (mutations) in the breast cancer genes BRCA1 and BRCA2. Results of the assessment will determine the need for genetic counseling and  BRCA1 and BRCA2 testing.  Routine pelvic exams to screen for cancer are no longer recommended for nonpregnant women who are considered low risk for cancer of the pelvic organs (ovaries, uterus, and vagina) and who do not have symptoms. Ask your health care provider if a screening pelvic exam is right for you.  If you have had past treatment for cervical cancer or a condition that could lead to cancer, you need Pap tests and screening for cancer for at least 20 years after your treatment. If Pap tests have been discontinued, your risk factors (such as having a new sexual partner) need to be reassessed to determine if screening should be resumed. Some women have medical problems that increase the chance of getting cervical cancer. In these cases, your health care provider may recommend more frequent screening and Pap tests.  The HPV test is an additional test that may be used for cervical cancer screening. The HPV test looks for the virus that can cause the cell changes on the cervix. The cells collected during the Pap test can be  tested for HPV. The HPV test could be used to screen women aged 30 years and older, and should be used in women of any age who have unclear Pap test results. After the age of 30, women should have HPV testing at the same frequency as a Pap test.  Colorectal cancer can be detected and often prevented. Most routine colorectal cancer screening begins at the age of 50 years and continues through age 75 years. However, your health care provider may recommend screening at an earlier age if you have risk factors for colon cancer. On a yearly basis, your health care provider may provide home test kits to check for hidden blood in the stool. Use of a small camera at the end of a tube, to directly examine the colon (sigmoidoscopy or colonoscopy), can detect the earliest forms of colorectal cancer. Talk to your health care provider about this at age 50, when routine screening begins. Direct  exam of the colon should be repeated every 5-10 years through age 75 years, unless early forms of pre-cancerous polyps or small growths are found.  People who are at an increased risk for hepatitis B should be screened for this virus. You are considered at high risk for hepatitis B if:  You were born in a country where hepatitis B occurs often. Talk with your health care provider about which countries are considered high risk.  Your parents were born in a high-risk country and you have not received a shot to protect against hepatitis B (hepatitis B vaccine).  You have HIV or AIDS.  You use needles to inject street drugs.  You live with, or have sex with, someone who has hepatitis B.  You get hemodialysis treatment.  You take certain medicines for conditions like cancer, organ transplantation, and autoimmune conditions.  Hepatitis C blood testing is recommended for all people born from 1945 through 1965 and any individual with known risks for hepatitis C.  Practice safe sex. Use condoms and avoid high-risk sexual practices to reduce the spread of sexually transmitted infections (STIs). STIs include gonorrhea, chlamydia, syphilis, trichomonas, herpes, HPV, and human immunodeficiency virus (HIV). Herpes, HIV, and HPV are viral illnesses that have no cure. They can result in disability, cancer, and death.  You should be screened for sexually transmitted illnesses (STIs) including gonorrhea and chlamydia if:  You are sexually active and are younger than 24 years.  You are older than 24 years and your health care provider tells you that you are at risk for this type of infection.  Your sexual activity has changed since you were last screened and you are at an increased risk for chlamydia or gonorrhea. Ask your health care provider if you are at risk.  If you are at risk of being infected with HIV, it is recommended that you take a prescription medicine daily to prevent HIV infection. This is  called preexposure prophylaxis (PrEP). You are considered at risk if:  You are a heterosexual woman, are sexually active, and are at increased risk for HIV infection.  You take drugs by injection.  You are sexually active with a partner who has HIV.  Talk with your health care provider about whether you are at high risk of being infected with HIV. If you choose to begin PrEP, you should first be tested for HIV. You should then be tested every 3 months for as long as you are taking PrEP.  Osteoporosis is a disease in which the bones lose minerals and strength   with aging. This can result in serious bone fractures or breaks. The risk of osteoporosis can be identified using a bone density scan. Women ages 65 years and over and women at risk for fractures or osteoporosis should discuss screening with their health care providers. Ask your health care provider whether you should take a calcium supplement or vitamin D to reduce the rate of osteoporosis.  Menopause can be associated with physical symptoms and risks. Hormone replacement therapy is available to decrease symptoms and risks. You should talk to your health care provider about whether hormone replacement therapy is right for you.  Use sunscreen. Apply sunscreen liberally and repeatedly throughout the day. You should seek shade when your shadow is shorter than you. Protect yourself by wearing long sleeves, pants, a wide-brimmed hat, and sunglasses year round, whenever you are outdoors.  Once a month, do a whole body skin exam, using a mirror to look at the skin on your back. Tell your health care provider of new moles, moles that have irregular borders, moles that are larger than a pencil eraser, or moles that have changed in shape or color.  Stay current with required vaccines (immunizations).  Influenza vaccine. All adults should be immunized every year.  Tetanus, diphtheria, and acellular pertussis (Td, Tdap) vaccine. Pregnant women should  receive 1 dose of Tdap vaccine during each pregnancy. The dose should be obtained regardless of the length of time since the last dose. Immunization is preferred during the 27th-36th week of gestation. An adult who has not previously received Tdap or who does not know her vaccine status should receive 1 dose of Tdap. This initial dose should be followed by tetanus and diphtheria toxoids (Td) booster doses every 10 years. Adults with an unknown or incomplete history of completing a 3-dose immunization series with Td-containing vaccines should begin or complete a primary immunization series including a Tdap dose. Adults should receive a Td booster every 10 years.  Varicella vaccine. An adult without evidence of immunity to varicella should receive 2 doses or a second dose if she has previously received 1 dose. Pregnant females who do not have evidence of immunity should receive the first dose after pregnancy. This first dose should be obtained before leaving the health care facility. The second dose should be obtained 4-8 weeks after the first dose.  Human papillomavirus (HPV) vaccine. Females aged 13-26 years who have not received the vaccine previously should obtain the 3-dose series. The vaccine is not recommended for use in pregnant females. However, pregnancy testing is not needed before receiving a dose. If a female is found to be pregnant after receiving a dose, no treatment is needed. In that case, the remaining doses should be delayed until after the pregnancy. Immunization is recommended for any person with an immunocompromised condition through the age of 26 years if she did not get any or all doses earlier. During the 3-dose series, the second dose should be obtained 4-8 weeks after the first dose. The third dose should be obtained 24 weeks after the first dose and 16 weeks after the second dose.  Zoster vaccine. One dose is recommended for adults aged 60 years or older unless certain conditions are  present.  Measles, mumps, and rubella (MMR) vaccine. Adults born before 1957 generally are considered immune to measles and mumps. Adults born in 1957 or later should have 1 or more doses of MMR vaccine unless there is a contraindication to the vaccine or there is laboratory evidence of immunity to   each of the three diseases. A routine second dose of MMR vaccine should be obtained at least 28 days after the first dose for students attending postsecondary schools, health care workers, or international travelers. People who received inactivated measles vaccine or an unknown type of measles vaccine during 1963-1967 should receive 2 doses of MMR vaccine. People who received inactivated mumps vaccine or an unknown type of mumps vaccine before 1979 and are at high risk for mumps infection should consider immunization with 2 doses of MMR vaccine. For females of childbearing age, rubella immunity should be determined. If there is no evidence of immunity, females who are not pregnant should be vaccinated. If there is no evidence of immunity, females who are pregnant should delay immunization until after pregnancy. Unvaccinated health care workers born before 1957 who lack laboratory evidence of measles, mumps, or rubella immunity or laboratory confirmation of disease should consider measles and mumps immunization with 2 doses of MMR vaccine or rubella immunization with 1 dose of MMR vaccine.  Pneumococcal 13-valent conjugate (PCV13) vaccine. When indicated, a person who is uncertain of her immunization history and has no record of immunization should receive the PCV13 vaccine. An adult aged 19 years or older who has certain medical conditions and has not been previously immunized should receive 1 dose of PCV13 vaccine. This PCV13 should be followed with a dose of pneumococcal polysaccharide (PPSV23) vaccine. The PPSV23 vaccine dose should be obtained at least 8 weeks after the dose of PCV13 vaccine. An adult aged 19  years or older who has certain medical conditions and previously received 1 or more doses of PPSV23 vaccine should receive 1 dose of PCV13. The PCV13 vaccine dose should be obtained 1 or more years after the last PPSV23 vaccine dose.  Pneumococcal polysaccharide (PPSV23) vaccine. When PCV13 is also indicated, PCV13 should be obtained first. All adults aged 65 years and older should be immunized. An adult younger than age 65 years who has certain medical conditions should be immunized. Any person who resides in a nursing home or long-term care facility should be immunized. An adult smoker should be immunized. People with an immunocompromised condition and certain other conditions should receive both PCV13 and PPSV23 vaccines. People with human immunodeficiency virus (HIV) infection should be immunized as soon as possible after diagnosis. Immunization during chemotherapy or radiation therapy should be avoided. Routine use of PPSV23 vaccine is not recommended for American Indians, [place] Natives, or people younger than 65 years unless there are medical conditions that require PPSV23 vaccine. When indicated, people who have unknown immunization and have no record of immunization should receive PPSV23 vaccine. One-time revaccination 5 years after the first dose of PPSV23 is recommended for people aged 19-64 years who have chronic kidney failure, nephrotic syndrome, asplenia, or immunocompromised conditions. People who received 1-2 doses of PPSV23 before age 65 years should receive another dose of PPSV23 vaccine at age 65 years or later if at least 5 years have passed since the previous dose. Doses of PPSV23 are not needed for people immunized with PPSV23 at or after age 65 years.  Meningococcal vaccine. Adults with asplenia or persistent complement component deficiencies should receive 2 doses of quadrivalent meningococcal conjugate (MenACWY-D) vaccine. The doses should be obtained at least 2 months apart.  Microbiologists working with certain meningococcal bacteria, military recruits, people at risk during an outbreak, and people who travel to or live in countries with a high rate of meningitis should be immunized. A first-year college student up through age   21 years who is living in a residence hall should receive a dose if she did not receive a dose on or after her 16th birthday. Adults who have certain high-risk conditions should receive one or more doses of vaccine.  Hepatitis A vaccine. Adults who wish to be protected from this disease, have certain high-risk conditions, work with hepatitis A-infected animals, work in hepatitis A research labs, or travel to or work in countries with a high rate of hepatitis A should be immunized. Adults who were previously unvaccinated and who anticipate close contact with an international adoptee during the first 60 days after arrival in the [place] from a country with a high rate of hepatitis A should be immunized.  Hepatitis B vaccine. Adults who wish to be protected from this disease, have certain high-risk conditions, may be exposed to blood or other infectious body fluids, are household contacts or sex partners of hepatitis B positive people, are clients or workers in certain care facilities, or travel to or work in countries with a high rate of hepatitis B should be immunized.  Haemophilus influenzae type b (Hib) vaccine. A previously unvaccinated person with asplenia or sickle cell disease or having a scheduled splenectomy should receive 1 dose of Hib vaccine. Regardless of previous immunization, a recipient of a hematopoietic stem cell transplant should receive a 3-dose series 6-12 months after her successful transplant. Hib vaccine is not recommended for adults with HIV infection. Preventive Services / Frequency Ages 64 to 68 years  Blood pressure check.** / Every 1 to 2 years.  Lipid and cholesterol check.** / Every 5 years beginning at age  22.  Clinical breast exam.** / Every 3 years for women in their 88s and 53s.  BRCA-related cancer risk assessment.** / For women who have family members with a BRCA-related cancer (breast, ovarian, tubal, or peritoneal cancers).  Pap test.** / Every 2 years from ages 90 through 51. Every 3 years starting at age 21 through age 56 or 3 with a history of 3 consecutive normal Pap tests.  HPV screening.** / Every 3 years from ages 24 through ages 1 to 46 with a history of 3 consecutive normal Pap tests.  Hepatitis C blood test.** / For any individual with known risks for hepatitis C.  Skin self-exam. / Monthly.  Influenza vaccine. / Every year.  Tetanus, diphtheria, and acellular pertussis (Tdap, Td) vaccine.** / Consult your health care provider. Pregnant women should receive 1 dose of Tdap vaccine during each pregnancy. 1 dose of Td every 10 years.  Varicella vaccine.** / Consult your health care provider. Pregnant females who do not have evidence of immunity should receive the first dose after pregnancy.  HPV vaccine. / 3 doses over 6 months, if 72 and younger. The vaccine is not recommended for use in pregnant females. However, pregnancy testing is not needed before receiving a dose.  Measles, mumps, rubella (MMR) vaccine.** / You need at least 1 dose of MMR if you were born in 1957 or later. You may also need a 2nd dose. For females of childbearing age, rubella immunity should be determined. If there is no evidence of immunity, females who are not pregnant should be vaccinated. If there is no evidence of immunity, females who are pregnant should delay immunization until after pregnancy.  Pneumococcal 13-valent conjugate (PCV13) vaccine.** / Consult your health care provider.  Pneumococcal polysaccharide (PPSV23) vaccine.** / 1 to 2 doses if you smoke cigarettes or if you have certain conditions.  Meningococcal vaccine.** /  1 dose if you are age 19 to 21 years and a first-year college  student living in a residence hall, or have one of several medical conditions, you need to get vaccinated against meningococcal disease. You may also need additional booster doses.  Hepatitis A vaccine.** / Consult your health care provider.  Hepatitis B vaccine.** / Consult your health care provider.  Haemophilus influenzae type b (Hib) vaccine.** / Consult your health care provider. Ages 40 to 64 years  Blood pressure check.** / Every 1 to 2 years.  Lipid and cholesterol check.** / Every 5 years beginning at age 20 years.  Lung cancer screening. / Every year if you are aged 55-80 years and have a 30-pack-year history of smoking and currently smoke or have quit within the past 15 years. Yearly screening is stopped once you have quit smoking for at least 15 years or develop a health problem that would prevent you from having lung cancer treatment.  Clinical breast exam.** / Every year after age 40 years.  BRCA-related cancer risk assessment.** / For women who have family members with a BRCA-related cancer (breast, ovarian, tubal, or peritoneal cancers).  Mammogram.** / Every year beginning at age 40 years and continuing for as long as you are in good health. Consult with your health care provider.  Pap test.** / Every 3 years starting at age 30 years through age 65 or 70 years with a history of 3 consecutive normal Pap tests.  HPV screening.** / Every 3 years from ages 30 years through ages 65 to 70 years with a history of 3 consecutive normal Pap tests.  Fecal occult blood test (FOBT) of stool. / Every year beginning at age 50 years and continuing until age 75 years. You may not need to do this test if you get a colonoscopy every 10 years.  Flexible sigmoidoscopy or colonoscopy.** / Every 5 years for a flexible sigmoidoscopy or every 10 years for a colonoscopy beginning at age 50 years and continuing until age 75 years.  Hepatitis C blood test.** / For all people born from 1945 through  1965 and any individual with known risks for hepatitis C.  Skin self-exam. / Monthly.  Influenza vaccine. / Every year.  Tetanus, diphtheria, and acellular pertussis (Tdap/Td) vaccine.** / Consult your health care provider. Pregnant women should receive 1 dose of Tdap vaccine during each pregnancy. 1 dose of Td every 10 years.  Varicella vaccine.** / Consult your health care provider. Pregnant females who do not have evidence of immunity should receive the first dose after pregnancy.  Zoster vaccine.** / 1 dose for adults aged 60 years or older.  Measles, mumps, rubella (MMR) vaccine.** / You need at least 1 dose of MMR if you were born in 1957 or later. You may also need a 2nd dose. For females of childbearing age, rubella immunity should be determined. If there is no evidence of immunity, females who are not pregnant should be vaccinated. If there is no evidence of immunity, females who are pregnant should delay immunization until after pregnancy.  Pneumococcal 13-valent conjugate (PCV13) vaccine.** / Consult your health care provider.  Pneumococcal polysaccharide (PPSV23) vaccine.** / 1 to 2 doses if you smoke cigarettes or if you have certain conditions.  Meningococcal vaccine.** / Consult your health care provider.  Hepatitis A vaccine.** / Consult your health care provider.  Hepatitis B vaccine.** / Consult your health care provider.  Haemophilus influenzae type b (Hib) vaccine.** / Consult your health care provider. Ages 65   years and over  Blood pressure check.** / Every 1 to 2 years.  Lipid and cholesterol check.** / Every 5 years beginning at age 22 years.  Lung cancer screening. / Every year if you are aged 73-80 years and have a 30-pack-year history of smoking and currently smoke or have quit within the past 15 years. Yearly screening is stopped once you have quit smoking for at least 15 years or develop a health problem that would prevent you from having lung cancer  treatment.  Clinical breast exam.** / Every year after age 4 years.  BRCA-related cancer risk assessment.** / For women who have family members with a BRCA-related cancer (breast, ovarian, tubal, or peritoneal cancers).  Mammogram.** / Every year beginning at age 40 years and continuing for as long as you are in good health. Consult with your health care provider.  Pap test.** / Every 3 years starting at age 9 years through age 34 or 91 years with 3 consecutive normal Pap tests. Testing can be stopped between 65 and 70 years with 3 consecutive normal Pap tests and no abnormal Pap or HPV tests in the past 10 years.  HPV screening.** / Every 3 years from ages 57 years through ages 64 or 45 years with a history of 3 consecutive normal Pap tests. Testing can be stopped between 65 and 70 years with 3 consecutive normal Pap tests and no abnormal Pap or HPV tests in the past 10 years.  Fecal occult blood test (FOBT) of stool. / Every year beginning at age 15 years and continuing until age 17 years. You may not need to do this test if you get a colonoscopy every 10 years.  Flexible sigmoidoscopy or colonoscopy.** / Every 5 years for a flexible sigmoidoscopy or every 10 years for a colonoscopy beginning at age 86 years and continuing until age 71 years.  Hepatitis C blood test.** / For all people born from [birth year] through 1966 and any individual with known risks for hepatitis C.  Osteoporosis screening.** / A one-time screening for women ages 83 years and over and women at risk for fractures or osteoporosis.  Skin self-exam. / Monthly.  Influenza vaccine. / Every year.  Tetanus, diphtheria, and acellular pertussis (Tdap/Td) vaccine.** / 1 dose of Td every 10 years.  Varicella vaccine.** / Consult your health care provider.  Zoster vaccine.** / 1 dose for adults aged 61 years or older.  Pneumococcal 13-valent conjugate (PCV13) vaccine.** / Consult your health care provider.  Pneumococcal  polysaccharide (PPSV23) vaccine.** / 1 dose for all adults aged 28 years and older.  Meningococcal vaccine.** / Consult your health care provider.  Hepatitis A vaccine.** / Consult your health care provider.  Hepatitis B vaccine.** / Consult your health care provider.  Haemophilus influenzae type b (Hib) vaccine.** / Consult your health care provider. ** Family history and personal history of risk and conditions may change your health care provider's recommendations. Document Released: 05/05/2001 Document Revised: 07/24/2013 Document Reviewed: 08/04/2010 [facility] Patient Information 2015 [place], [place]. This information is not intended to replace advice given to you by your health care provider. Make sure you discuss any questions you have with your health care provider.

## 2014-12-06 NOTE — Progress Notes (Signed)
Patient ID: Lynn Dunlap, female   DOB: 09-Oct-1970, 44 y.o.   MRN: [id number]   Subjective:    Patient ID: Lynn Dunlap, female    DOB: [birth year], 44 y.o.   MRN: [id number]  Chief Complaint  Patient presents with  . Annual Exam    HPI Patient is in today for annual exam. Overall doing fairly well. Has infrequent headaches but gets good results from Imitrex and become a child when she needs them. 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 Despite this fact she is using alprazolam infrequently and feels she is managing well. No recent illness or acute concerns noted. Denies CP/palp/SOB/HA/congestion/fevers/GI or GU c/o. Taking meds as prescribed  Past Medical History  Diagnosis Date  . Depression 08/10/2010  . Preventative health care 01/02/2011  . Headache(784.0)   . Anxiety   . Anemia 01/17/2012  . Neutropenia 01/17/2012  . Cervical cancer screening 01/02/2011    Menarche at 33 G2P2 s/p 1 svd and 1 c/s, tubal No abnl paps or MGMs   . Overweight (BMI 25.0-29.9) 12/02/2012  . Elevated BP 12/02/2012  . Depression with anxiety 08/10/2010    Past Surgical History  Procedure Laterality Date  . Cesarean section      X 1 w/ tubal  . Tubal ligation      Family History  Problem Relation Age of Onset  . Cancer Mother     sarcoma, gyn cancer w/ hysterectomy  . Hypertension Father   . Asthma Brother   . Cancer Maternal Grandfather     prostate  . Hyperlipidemia Paternal Grandmother   . Glaucoma Paternal Grandfather     Social History   Social History  . Marital Status: Married    Spouse Name: N/A  . Number of Children: N/A  . Years of Education: N/A   Occupational History  . Not on file.   Social History Main Topics  . Smoking status: Never Smoker   . Smokeless tobacco: Never Used  . Alcohol Use: No  . Drug Use: No  . Sexual Activity:    Partners: Male     Comment: lives with  husband and children, no dietary restrictions   Other Topics Concern  . Not on file   Social History Narrative    Outpatient Prescriptions Prior to Visit  Medication Sig Dispense Refill  . ALPRAZolam (XANAX) 0.25 MG tablet TAKE ONE -HALF TO ONE TABLET BY MOUTH EVERY 12 HOURS AS NEEDED FOR ANXIETY 30 tablet 0  . butalbital-acetaminophen-caffeine (FIORICET) 50-325-40 MG per tablet Take 1 tablet by mouth every 6 (six) hours as needed. 70 tablet 0  . SUMAtriptan (IMITREX) 50 MG tablet Take 1 tablet (50 mg total) by mouth once. May repeat once in 2 hours if headache persists or recurs 10 tablet 0   No facility-administered medications prior to visit.    Allergies  Allergen Reactions  . Doxycycline   . Erythromycin   . Sulfonamide Derivatives     Review of Systems  Constitutional: Negative for fever, chills and malaise/fatigue.  HENT: Negative for congestion and hearing loss.   Eyes: Negative for discharge.  Respiratory: Negative for cough, sputum production and shortness of breath.   Cardiovascular: Negative for chest pain, palpitations and leg swelling.  Gastrointestinal: Negative for heartburn, nausea, vomiting, abdominal pain, diarrhea, constipation and blood in stool.  Genitourinary: Negative for dysuria, urgency, frequency and hematuria.  Musculoskeletal: Negative for  myalgias, back pain and falls.  Skin: Negative for rash.  Neurological: Positive for headaches. Negative for dizziness, sensory change, loss of consciousness and weakness.  Endo/Heme/Allergies: Negative for environmental allergies. Does not bruise/bleed easily.  Psychiatric/Behavioral: Negative for depression and suicidal ideas. The patient is nervous/anxious. The patient does not have insomnia.        Objective:    Physical Exam  Constitutional: She is oriented to person, place, and time. She appears well-developed and well-nourished. No distress.  HENT:  Head: Normocephalic and atraumatic.  Eyes:  Conjunctivae are normal.  Neck: Neck supple. No thyromegaly present.  Cardiovascular: Normal rate, regular rhythm and normal heart sounds.   No murmur heard. Pulmonary/Chest: Effort normal and breath sounds normal. No respiratory distress.  Abdominal: Soft. Bowel sounds are normal. She exhibits no distension and no mass. There is no tenderness.  Musculoskeletal: She exhibits no edema.  Lymphadenopathy:    She has no cervical adenopathy.  Neurological: She is alert and oriented to person, place, and time.  Skin: Skin is warm and dry.  Psychiatric: She has a normal mood and affect. Her behavior is normal.    BP 135/83 mmHg  Pulse 83  Temp(Src) 98.1 F (36.7 C) (Oral)  Ht 5' 6.5" (1.689 m)  Wt 171 lb 6.4 oz (77.747 kg)  BMI 27.25 kg/m2  SpO2 100%  LMP 11/22/2014 Wt Readings from Last 3 Encounters:  12/06/14 171 lb 6.4 oz (77.747 kg)  10/22/14 174 lb (78.926 kg)  06/07/14 163 lb (73.936 kg)     Lab Results  Component Value Date   WBC 6.0 11/30/2013   HGB 12.5 11/30/2013   HCT 37.5 11/30/2013   PLT 192.0 11/30/2013   GLUCOSE 73 09/08/2013   CHOL 144 09/08/2013   TRIG 44 09/08/2013   HDL 59 09/08/2013   LDLCALC 76 09/08/2013   ALT 13 09/08/2013   AST 16 09/08/2013   NA 137 09/08/2013   K 4.1 09/08/2013   CL 103 09/08/2013   CREATININE 0.66 09/08/2013   BUN 5* 09/08/2013   CO2 29 09/08/2013   TSH 1.239 09/08/2013   HGBA1C 4.9 09/08/2013    Lab Results  Component Value Date   TSH 1.239 09/08/2013   Lab Results  Component Value Date   WBC 6.0 11/30/2013   HGB 12.5 11/30/2013   HCT 37.5 11/30/2013   MCV 89.2 11/30/2013   PLT 192.0 11/30/2013   Lab Results  Component Value Date   NA 137 09/08/2013   K 4.1 09/08/2013   CO2 29 09/08/2013   GLUCOSE 73 09/08/2013   BUN 5* 09/08/2013   CREATININE 0.66 09/08/2013   BILITOT 0.5 09/08/2013   ALKPHOS 78 09/08/2013   AST 16 09/08/2013   ALT 13 09/08/2013   PROT 7.4 09/08/2013   ALBUMIN 3.9 09/08/2013    CALCIUM 9.0 09/08/2013   GFR 112.33 01/07/2012   Lab Results  Component Value Date   CHOL 144 09/08/2013   Lab Results  Component Value Date   HDL 59 09/08/2013   Lab Results  Component Value Date   LDLCALC 76 09/08/2013   Lab Results  Component Value Date   TRIG 44 09/08/2013   Lab Results  Component Value Date   CHOLHDL 2.4 09/08/2013   Lab Results  Component Value Date   HGBA1C 4.9 09/08/2013       Assessment & Plan:   Overweight (BMI 25.0-29.9) Encouraged DASH diet, decrease po intake and increase exercise as tolerated. Needs 7-8 hours of sleep nightly.  Avoid trans fats, eat small, frequent meals every 4-5 hours with lean proteins, complex carbs and healthy fats. Minimize simple carbs  Elevated BP Well controlled, no changes to meds. Encouraged heart healthy diet such as the DASH diet and exercise as tolerated.   Depression with anxiety Doing well. No recnet flares may use Xanax infrequently  Migraine Infrequent. Encouraged increased hydration, 64 ounces of clear fluids daily. Minimize alcohol and caffeine. Eat small frequent meals with lean proteins and complex carbs. Avoid high and low blood sugars. Get adequate sleep, 7-8 hours a night. Needs exercise daily preferably in the morning. May continue Butalbitol prn  Preventative health care Patient encouraged to maintain heart healthy diet, regular exercise, adequate sleep. Consider daily probiotics. Take medications as prescribed. Given and reviewed copy of ACP documents from [employer] and encouraged to complete and return. Has new job at [employer]. Fasting labs ordered and reviewed   I am having Ms. Sudduth maintain her butalbital-acetaminophen-caffeine, ALPRAZolam, and SUMAtriptan.  No orders of the defined types were placed in this encounter.     Elizabeth Sauer, LPN

## 2014-12-16 NOTE — Assessment & Plan Note (Signed)
Infrequent. Encouraged increased hydration, 64 ounces of clear fluids daily. Minimize alcohol and caffeine. Eat small frequent meals with lean proteins and complex carbs. Avoid high and low blood sugars. Get adequate sleep, 7-8 hours a night. Needs exercise daily preferably in the morning. May continue Butalbitol prn

## 2014-12-16 NOTE — Assessment & Plan Note (Signed)
Doing well. No recnet flares may use Xanax infrequently

## 2014-12-16 NOTE — Assessment & Plan Note (Addendum)
Patient encouraged to maintain heart healthy diet, regular exercise, adequate sleep. Consider daily probiotics. Take medications as prescribed. Given and reviewed copy of ACP documents from [employer] and encouraged to complete and return. Has new job at [employer]. Fasting labs ordered and reviewed

## 2014-12-16 NOTE — Assessment & Plan Note (Signed)
Well controlled, no changes to meds. Encouraged heart healthy diet such as the DASH diet and exercise as tolerated.  °

## 2015-02-16 ENCOUNTER — Encounter: Payer: Self-pay | Admitting: Family Medicine

## 2015-03-30 ENCOUNTER — Encounter: Payer: Self-pay | Admitting: Family Medicine

## 2015-04-01 ENCOUNTER — Encounter: Payer: Self-pay | Admitting: Family Medicine

## 2015-04-02 ENCOUNTER — Encounter: Payer: Self-pay | Admitting: Family Medicine

## 2015-04-02 ENCOUNTER — Other Ambulatory Visit: Payer: Self-pay | Admitting: Family Medicine

## 2015-04-02 MED ORDER — ACYCLOVIR 400 MG PO TABS: 400.0000 mg | ORAL_TABLET | Freq: Every day | ORAL | Status: DC

## 2015-05-27 ENCOUNTER — Encounter: Payer: Self-pay | Admitting: Family Medicine

## 2015-06-07 ENCOUNTER — Ambulatory Visit: Payer: 59 | Admitting: Family Medicine

## 2015-07-01 ENCOUNTER — Telehealth: Payer: Self-pay

## 2015-07-01 ENCOUNTER — Other Ambulatory Visit: Payer: Self-pay

## 2015-07-01 MED ORDER — ALPRAZOLAM 0.25 MG PO TABS: 0.2500 mg | ORAL_TABLET | Freq: Two times a day (BID) | ORAL | Status: DC | PRN

## 2015-07-01 NOTE — Addendum Note (Signed)
Addended by: Raiford Noble on: 07/01/2015 04:50 PM   Modules accepted: Orders

## 2015-07-01 NOTE — Telephone Encounter (Signed)
Patient requesting medication refill for xanax 0.25 mg 2 times daily as needed  To [facility] [place], [place]

## 2015-07-01 NOTE — Telephone Encounter (Signed)
Will give one month supply in PCP absence. Further fills will have to come from Dr. Charlett Blake.

## 2015-07-16 ENCOUNTER — Encounter: Payer: Self-pay | Admitting: Family Medicine

## 2015-07-16 ENCOUNTER — Ambulatory Visit (INDEPENDENT_AMBULATORY_CARE_PROVIDER_SITE_OTHER): Payer: Self-pay | Admitting: Family Medicine

## 2015-07-16 VITALS — BP 120/76 | HR 76 | Temp 98.1°F | Ht 67.0 in | Wt 177.5 lb

## 2015-07-16 DIAGNOSIS — R030 Elevated blood-pressure reading, without diagnosis of hypertension: Secondary | ICD-10-CM

## 2015-07-16 DIAGNOSIS — G43809 Other migraine, not intractable, without status migrainosus: Secondary | ICD-10-CM

## 2015-07-16 DIAGNOSIS — F418 Other specified anxiety disorders: Secondary | ICD-10-CM

## 2015-07-16 DIAGNOSIS — IMO0001 Reserved for inherently not codable concepts without codable children: Secondary | ICD-10-CM

## 2015-07-16 DIAGNOSIS — G43001 Migraine without aura, not intractable, with status migrainosus: Secondary | ICD-10-CM

## 2015-07-16 MED ORDER — ALPRAZOLAM 0.25 MG PO TABS: 0.2500 mg | ORAL_TABLET | Freq: Two times a day (BID) | ORAL | Status: DC | PRN

## 2015-07-16 MED ORDER — BUTALBITAL-APAP-CAFFEINE 50-325-40 MG PO TABS: 1.0000 | ORAL_TABLET | Freq: Four times a day (QID) | ORAL | Status: AC | PRN

## 2015-07-16 NOTE — Patient Instructions (Signed)
Analgesic Rebound Headaches An analgesic rebound headache is a headache that returns after pain medicine (analgesic) that was taken to treat the initial headache wears off. People who suffer from tension, migraine, or cluster headaches are at risk for developing rebound headaches. Any type of primary headache can return as a rebound headache if you regularly take analgesics more than three times a week. If the cycle of rebound headaches continues, they become chronic daily headaches.  CAUSES Analgesics frequently associated with this problem include common over-the-counter medicines like aspirin, ibuprofen, acetaminophen, sinus relief medicines, and other medicines that contain caffeine. Narcotic pain medicines are also a common cause of rebound headaches.  SIGNS AND SYMPTOMS The symptoms of rebound headaches are the same as the symptoms of your initial headache. Symptoms of specific types of headaches include: Tension headache  Pressure around the head.  Dull, aching head pain.  Pain felt over the front and sides of the head.  Tenderness in the muscles of the head, neck and shoulders. Migraine Headache  Pulsing or throbbing pain on one or both sides of the head.  Severe pain that interferes with daily activities.  Pain that is worsened by physical activity.  Nausea, vomiting, or both.  Pain with exposure to bright light, loud noises, or strong smells.  General sensitivity to bright light, loud noises, or strong smells.  Visual changes.  Numbness of one or both arms. Cluster Headaches  Severe pain that begins in or around one eye or temple.  Redness in the eye on the same side as the pain.  Droopy or swollen eyelid.  One-sided head pain.  Nausea.  Runny nose.  Sweaty, pale facial skin.  Restlessness. DIAGNOSIS  Analgesic rebound headaches are diagnosed by reviewing your medical history. This includes the nature of your initial headaches, as well as the type of pain  medicines you have been using to treat your headaches and how often you take them. TREATMENT Discontinuing frequent use of the analgesic medicine will typically reduce the frequency of the rebound episodes. This may initially worsen your headaches but eventually the pain should become more manageable, less frequent, and less severe.  Seeing a headache specialists may helpful. He or she may be able to help you manage your headaches and to make sure there is not another cause of the headaches. Alternative methods of stress relief such as acupuncture, counseling, biofeedback, and massage may also be helpful. Talk with your health care provider about which alternative treatments might be good for you. HOME CARE INSTRUCTIONS Stopping the regular use of pain medicine can be difficult. Follow your health care provider's instructions carefully. Keep all of your appointments. Avoid triggers that are known to cause your primary headaches. SEEK MEDICAL CARE IF: You continue to experience headaches after following your health care provider's recommended treatments. SEEK IMMEDIATE MEDICAL CARE IF:  You develop new headache pain.  You develop headache pain that is different than what you have experienced in the past.  You develop numbness or tingling in your arms or legs.  You develop changes in your speech or vision. MAKE SURE YOU:  Understand these instructions.  Will watch your child's condition.  Will get help right away if your child is not doing well or gets worse.   This information is not intended to replace advice given to you by your health care provider. Make sure you discuss any questions you have with your health care provider.   Document Released: 05/30/2003 Document Revised: 03/30/2014 Document Reviewed: 09/22/2012 Elsevier   Interactive Patient Education 2016 [employer] Inc[employer]  

## 2015-07-16 NOTE — Progress Notes (Signed)
Subjective:    Patient ID: Lynn Dunlap, female    DOB: [birth year], 45 y.o.   MRN: [id number]  Chief Complaint  Patient presents with  . Follow-up    6 month    HPI Patient is in today for follow up. She is feeling well today, no recent illness or acute concerns. She has switched job and her stress level has improved some. Denies CP/palp/SOB/HA/congestion/fevers/GI or GU c/o. Taking meds as prescribed  Past Medical History  Diagnosis Date  . Depression 08/10/2010  . Preventative health care 01/02/2011  . Headache(784.0)   . Anxiety   . Anemia 01/17/2012  . Neutropenia ([place]) 01/17/2012  . Cervical cancer screening 01/02/2011    Menarche at 40 G2P2 s/p 1 svd and 1 c/s, tubal No abnl paps or MGMs   . Overweight (BMI 25.0-29.9) 12/02/2012  . Elevated BP 12/02/2012  . Depression with anxiety 08/10/2010    Past Surgical History  Procedure Laterality Date  . Cesarean section      X 1 w/ tubal  . Tubal ligation      Family History  Problem Relation Age of Onset  . Cancer Mother     sarcoma, gyn cancer w/ hysterectomy  . Hypertension Father   . Asthma Brother   . Cancer Maternal Grandfather     prostate  . Hyperlipidemia Paternal Grandmother   . Glaucoma Paternal Grandfather     Social History   Social History  . Marital Status: Married    Spouse Name: N/A  . Number of Children: N/A  . Years of Education: N/A   Occupational History  . Not on file.   Social History Main Topics  . Smoking status: Never Smoker   . Smokeless tobacco: Never Used  . Alcohol Use: No  . Drug Use: No  . Sexual Activity:    Partners: Male     Comment: lives with husband and children, no dietary restrictions, works at [employer]   Other Topics Concern  . Not on file   Social History Narrative    Outpatient Prescriptions Prior to Visit  Medication Sig Dispense Refill  . SUMAtriptan (IMITREX) 50 MG tablet Take 1 tablet (50 mg total) by mouth once. May repeat once in 2 hours  if headache persists or recurs 10 tablet 0  . ALPRAZolam (XANAX) 0.25 MG tablet Take 1 tablet (0.25 mg total) by mouth 2 (two) times daily as needed for anxiety. 30 tablet 0  . acyclovir (ZOVIRAX) 400 MG tablet Take 1 tablet (400 mg total) by mouth 5 (five) times daily. (Patient not taking: Reported on 07/16/2015) 35 tablet 0   No facility-administered medications prior to visit.    Allergies  Allergen Reactions  . Doxycycline   . Erythromycin   . Sulfonamide Derivatives     ROS     Objective:    Physical Exam  BP 120/76 mmHg  Pulse 76  Temp(Src) 98.1 F (36.7 C) (Oral)  Ht 5\' 7"  (1.702 m)  Wt 177 lb 8 oz (80.513 kg)  BMI 27.79 kg/m2  SpO2 96% Wt Readings from Last 3 Encounters:  07/16/15 177 lb 8 oz (80.513 kg)  12/06/14 171 lb 6.4 oz (77.747 kg)  10/22/14 174 lb (78.926 kg)     Lab Results  Component Value Date   WBC 6.0 12/06/2014   HGB 12.6 12/06/2014   HCT 37.6 12/06/2014   PLT 208.0 12/06/2014   GLUCOSE 78 12/06/2014   CHOL 159 12/06/2014   TRIG 45.0 12/06/2014  HDL 61.80 12/06/2014   LDLCALC 88 12/06/2014   ALT 28 12/06/2014   AST 28 12/06/2014   NA 139 12/06/2014   K 4.1 12/06/2014   CL 105 12/06/2014   CREATININE 0.73 12/06/2014   BUN 8 12/06/2014   CO2 29 12/06/2014   TSH 0.88 12/06/2014   HGBA1C 4.9 09/08/2013    Lab Results  Component Value Date   TSH 0.88 12/06/2014   Lab Results  Component Value Date   WBC 6.0 12/06/2014   HGB 12.6 12/06/2014   HCT 37.6 12/06/2014   MCV 88.3 12/06/2014   PLT 208.0 12/06/2014   Lab Results  Component Value Date   NA 139 12/06/2014   K 4.1 12/06/2014   CO2 29 12/06/2014   GLUCOSE 78 12/06/2014   BUN 8 12/06/2014   CREATININE 0.73 12/06/2014   BILITOT 0.6 12/06/2014   ALKPHOS 88 12/06/2014   AST 28 12/06/2014   ALT 28 12/06/2014   PROT 7.9 12/06/2014   ALBUMIN 4.0 12/06/2014   CALCIUM 9.5 12/06/2014   GFR 110.81 12/06/2014   Lab Results  Component Value Date   CHOL 159 12/06/2014    Lab Results  Component Value Date   HDL 61.80 12/06/2014   Lab Results  Component Value Date   LDLCALC 88 12/06/2014   Lab Results  Component Value Date   TRIG 45.0 12/06/2014   Lab Results  Component Value Date   CHOLHDL 3 12/06/2014   Lab Results  Component Value Date   HGBA1C 4.9 09/08/2013       Assessment & Plan:   Problem List Items Addressed This Visit    Migraine - Primary    Well controlled, no changes to meds. Encouraged heart healthy diet such as the DASH diet and exercise as tolerated.       Relevant Medications   butalbital-acetaminophen-caffeine (FIORICET) 50-325-40 MG tablet   Elevated BP    Well controlled. Encouraged heart healthy diet such as the DASH diet and exercise as tolerated.       Depression with anxiety    Doing well on current meds, given refills today         I have discontinued Lynn Dunlap's Butalbital-APAP-Caffeine. I have also changed her butalbital-acetaminophen-caffeine. Additionally, I am having her maintain her SUMAtriptan, acyclovir, and ALPRAZolam.  Meds ordered this encounter  Medications  . DISCONTD: Butalbital-APAP-Caffeine (FIORICET) 50-300-40 MG CAPS    Sig: Take by mouth.  . butalbital-acetaminophen-caffeine (FIORICET) 50-325-40 MG tablet    Sig: Take 1 tablet by mouth every 6 (six) hours as needed.    Dispense:  60 tablet    Refill:  1  . ALPRAZolam (XANAX) 0.25 MG tablet    Sig: Take 1 tablet (0.25 mg total) by mouth 2 (two) times daily as needed for anxiety.    Dispense:  30 tablet    Refill:  0     Penni Homans, MD

## 2015-07-16 NOTE — Progress Notes (Signed)
Pre visit review using our clinic review tool, if applicable. No additional management support is needed unless otherwise documented below in the visit note. 

## 2015-07-19 ENCOUNTER — Other Ambulatory Visit: Payer: Self-pay

## 2015-07-19 DIAGNOSIS — Z1231 Encounter for screening mammogram for malignant neoplasm of breast: Secondary | ICD-10-CM

## 2015-07-29 NOTE — Assessment & Plan Note (Signed)
Well controlled. Encouraged heart healthy diet such as the DASH diet and exercise as tolerated.  

## 2015-07-29 NOTE — Assessment & Plan Note (Signed)
Well controlled, no changes to meds. Encouraged heart healthy diet such as the DASH diet and exercise as tolerated.  °

## 2015-07-29 NOTE — Assessment & Plan Note (Signed)
Doing well on current meds, given refills today. 

## 2015-08-05 ENCOUNTER — Ambulatory Visit
Admission: RE | Admit: 2015-08-05 | Discharge: 2015-08-05 | Disposition: A | Discharge status: Home / Self Care | Payer: BLUE CROSS/BLUE SHIELD | Source: Ambulatory Visit

## 2015-08-05 DIAGNOSIS — Z1231 Encounter for screening mammogram for malignant neoplasm of breast: Secondary | ICD-10-CM

## 2015-08-29 ENCOUNTER — Encounter: Payer: Self-pay | Admitting: Obstetrics & Gynecology

## 2015-09-09 ENCOUNTER — Telehealth: Payer: Self-pay | Admitting: Family Medicine

## 2015-09-09 NOTE — Telephone Encounter (Signed)
Ok to refill Alprazolam same sig, same number, #30 with 2 rf

## 2015-09-09 NOTE — Telephone Encounter (Signed)
Can be reached: [phone] Pharmacy: [id number] - [place], [place] - [address]   Reason for call: pt has a couple xanax left. She said she normally has refills on RX but there were no refills this time.

## 2015-09-09 NOTE — Telephone Encounter (Signed)
Requesting:  Alprazolam Contract   Signed on 06/07/2014 UDS  Low risk, due Last OV   07/16/2015 Last Refill    #30 with 0 refills on 07/16/2015  Please Advise

## 2015-09-10 MED ORDER — ALPRAZOLAM 0.25 MG PO TABS: 0.2500 mg | ORAL_TABLET | Freq: Two times a day (BID) | ORAL | Status: DC | PRN

## 2015-09-10 NOTE — Telephone Encounter (Signed)
[facility] [place].

## 2015-10-10 ENCOUNTER — Telehealth: Payer: Self-pay | Admitting: Family Medicine

## 2015-10-10 ENCOUNTER — Encounter: Payer: Self-pay | Admitting: Obstetrics & Gynecology

## 2015-10-10 ENCOUNTER — Ambulatory Visit: Payer: BLUE CROSS/BLUE SHIELD | Admitting: Family Medicine

## 2015-10-10 DIAGNOSIS — Z0289 Encounter for other administrative examinations: Secondary | ICD-10-CM

## 2015-10-10 NOTE — Telephone Encounter (Signed)
Patient Lynn Dunlap at 12:30pm today cancelling her 3pm reason unknown tried reaching out to patient to [facility], unsuccessful. Charge or no charge

## 2015-10-10 NOTE — Telephone Encounter (Signed)
No charge. 

## 2015-10-11 ENCOUNTER — Encounter: Payer: Self-pay | Admitting: Family Medicine

## 2015-10-11 ENCOUNTER — Other Ambulatory Visit: Payer: Self-pay | Admitting: Family Medicine

## 2015-10-11 MED ORDER — PHENTERMINE HCL 15 MG PO CAPS: 15.0000 mg | ORAL_CAPSULE | Freq: Every day | ORAL | Status: DC

## 2015-10-25 ENCOUNTER — Encounter: Payer: Self-pay | Admitting: Family Medicine

## 2015-10-25 ENCOUNTER — Ambulatory Visit (INDEPENDENT_AMBULATORY_CARE_PROVIDER_SITE_OTHER): Payer: BLUE CROSS/BLUE SHIELD | Admitting: Family Medicine

## 2015-10-25 DIAGNOSIS — R030 Elevated blood-pressure reading, without diagnosis of hypertension: Secondary | ICD-10-CM | POA: Diagnosis not present

## 2015-10-25 DIAGNOSIS — E663 Overweight: Secondary | ICD-10-CM

## 2015-10-25 DIAGNOSIS — IMO0001 Reserved for inherently not codable concepts without codable children: Secondary | ICD-10-CM

## 2015-10-25 DIAGNOSIS — F418 Other specified anxiety disorders: Secondary | ICD-10-CM

## 2015-10-25 MED ORDER — PHENTERMINE HCL 15 MG PO CAPS: 15.0000 mg | ORAL_CAPSULE | Freq: Every day | ORAL | 2 refills | Status: DC

## 2015-10-25 NOTE — Patient Instructions (Signed)
NOW probiotic  Constipation, Adult Constipation is when a person has fewer than three bowel movements a week, has difficulty having a bowel movement, or has stools that are dry, hard, or larger than normal. As people grow older, constipation is more common. A low-fiber diet, not taking in enough fluids, and taking certain medicines may make constipation worse.  CAUSES   Certain medicines, such as antidepressants, pain medicine, iron supplements, antacids, and water pills.   Certain diseases, such as diabetes, irritable bowel syndrome (IBS), thyroid disease, or depression.   Not drinking enough water.   Not eating enough fiber-rich foods.   Stress or travel.   Lack of physical activity or exercise.   Ignoring the urge to have a bowel movement.   Using laxatives too much.  SIGNS AND SYMPTOMS   Having fewer than three bowel movements a week.   Straining to have a bowel movement.   Having stools that are hard, dry, or larger than normal.   Feeling full or bloated.   Pain in the lower abdomen.   Not feeling relief after having a bowel movement.  DIAGNOSIS  Your health care provider will take a medical history and perform a physical exam. Further testing may be done for severe constipation. Some tests may include:  A barium enema X-ray to examine your rectum, colon, and, sometimes, your small intestine.   A sigmoidoscopy to examine your lower colon.   A colonoscopy to examine your entire colon. TREATMENT  Treatment will depend on the severity of your constipation and what is causing it. Some dietary treatments include drinking more fluids and eating more fiber-rich foods. Lifestyle treatments may include regular exercise. If these diet and lifestyle recommendations do not help, your health care provider may recommend taking over-the-counter laxative medicines to help you have bowel movements. Prescription medicines may be prescribed if over-the-counter medicines  do not work.  HOME CARE INSTRUCTIONS   Eat foods that have a lot of fiber, such as fruits, vegetables, whole grains, and beans.  Limit foods high in fat and processed sugars, such as french fries, hamburgers, cookies, candies, and soda.   A fiber supplement may be added to your diet if you cannot get enough fiber from foods.   Drink enough fluids to keep your urine clear or pale yellow.   Exercise regularly or as directed by your health care provider.   Go to the restroom when you have the urge to go. Do not hold it.   Only take over-the-counter or prescription medicines as directed by your health care provider. Do not take other medicines for constipation without talking to your health care provider first.  [place] IF:   You have bright red blood in your stool.   Your constipation lasts for more than 4 days or gets worse.   You have abdominal or rectal pain.   You have thin, pencil-like stools.   You have unexplained weight loss. MAKE SURE YOU:   Understand these instructions.  Will watch your condition.  Will get help right away if you are not doing well or get worse.   This information is not intended to replace advice given to you by your health care provider. Make sure you discuss any questions you have with your health care provider.   Document Released: 12/06/2003 Document Revised: 03/30/2014 Document Reviewed: 12/19/2012 Elsevier Interactive Patient Education [employer].

## 2015-11-10 NOTE — Assessment & Plan Note (Signed)
Well controlled. Encouraged heart healthy diet such as the DASH diet and exercise as tolerated.  

## 2015-11-10 NOTE — Assessment & Plan Note (Signed)
Uses  Alprazolam very infrequently. No changes at this time.

## 2015-11-10 NOTE — Progress Notes (Signed)
Patient ID: Lynn Dunlap, female   DOB: 05/19/1970, 45 y.o.   MRN: [id number]   Subjective:    Patient ID: Lynn Dunlap, female    DOB: 1970-07-30, 45 y.o.   MRN: [id number]  Chief Complaint  Patient presents with  . Follow-up    Medication Management    HPI Patient is in today for follow up. She is feeling well. No recent illness or acute complaints. Has been trying to eat a healthy diet and maintain adequate exercise. Denies CP/palp/SOB/HA/congestion/fevers/GI or GU c/o. Taking meds as prescribed  Past Medical History:  Diagnosis Date  . Anemia 01/17/2012  . Anxiety   . Cervical cancer screening 01/02/2011   Menarche at 64 G2P2 s/p 1 svd and 1 c/s, tubal No abnl paps or MGMs   . Depression 08/10/2010  . Depression with anxiety 08/10/2010  . Elevated BP 12/02/2012  . Headache(784.0)   . Neutropenia ([place]) 01/17/2012  . Overweight (BMI 25.0-29.9) 12/02/2012  . Preventative health care 01/02/2011    Past Surgical History:  Procedure Laterality Date  . CESAREAN SECTION     X 1 w/ tubal  . TUBAL LIGATION      Family History  Problem Relation Age of Onset  . Cancer Mother     sarcoma, gyn cancer w/ hysterectomy  . Hypertension Father   . Asthma Brother   . Cancer Maternal Grandfather     prostate  . Hyperlipidemia Paternal Grandmother   . Glaucoma Paternal Grandfather     Social History   Social History  . Marital status: Married    Spouse name: N/A  . Number of children: N/A  . Years of education: N/A   Occupational History  . Not on file.   Social History Main Topics  . Smoking status: Never Smoker  . Smokeless tobacco: Never Used  . Alcohol use No  . Drug use: No  . Sexual activity: Yes    Partners: Male     Comment: lives with husband and children, no dietary restrictions, works at [employer]   Other Topics Concern  . Not on file   Social History Narrative  . No narrative on file    Outpatient Medications Prior to Visit  Medication Sig  Dispense Refill  . acyclovir (ZOVIRAX) 400 MG tablet Take 1 tablet (400 mg total) by mouth 5 (five) times daily. 35 tablet 0  . ALPRAZolam (XANAX) 0.25 MG tablet Take 1 tablet (0.25 mg total) by mouth 2 (two) times daily as needed for anxiety. 30 tablet 2  . butalbital-acetaminophen-caffeine (FIORICET) 50-325-40 MG tablet Take 1 tablet by mouth every 6 (six) hours as needed. 60 tablet 1  . SUMAtriptan (IMITREX) 50 MG tablet Take 1 tablet (50 mg total) by mouth once. May repeat once in 2 hours if headache persists or recurs 10 tablet 0  . phentermine 15 MG capsule Take 1 capsule (15 mg total) by mouth daily. 30 capsule 0   No facility-administered medications prior to visit.     Allergies  Allergen Reactions  . Doxycycline   . Erythromycin   . Sulfonamide Derivatives     Review of Systems  Constitutional: Negative for fever and malaise/fatigue.  HENT: Negative for congestion.   Eyes: Negative for blurred vision.  Respiratory: Negative for shortness of breath.   Cardiovascular: Negative for chest pain, palpitations and leg swelling.  Gastrointestinal: Negative for abdominal pain, blood in stool and nausea.  Genitourinary: Negative for dysuria and frequency.  Musculoskeletal: Negative for falls.  Skin: Negative for rash.  Neurological: Negative for dizziness, loss of consciousness and headaches.  Endo/Heme/Allergies: Negative for environmental allergies.  Psychiatric/Behavioral: Negative for depression. The patient is not nervous/anxious.        Objective:    Physical Exam  Constitutional: She is oriented to person, place, and time. She appears well-developed and well-nourished. No distress.  HENT:  Head: Normocephalic and atraumatic.  Nose: Nose normal.  Eyes: Right eye exhibits no discharge. Left eye exhibits no discharge.  Neck: Normal range of motion. Neck supple.  Cardiovascular: Normal rate and regular rhythm.   No murmur heard. Pulmonary/Chest: Effort normal and breath  sounds normal.  Abdominal: Soft. Bowel sounds are normal. There is no tenderness.  Musculoskeletal: She exhibits no edema.  Neurological: She is alert and oriented to person, place, and time.  Skin: Skin is warm and dry.  Psychiatric: She has a normal mood and affect.  Nursing note and vitals reviewed.   BP 128/88 (BP Location: Right Arm, Patient Position: Sitting, Cuff Size: Normal)   Pulse 81   Temp 97.4 F (36.3 C) (Oral)   Resp 16   Ht 5\' 7"  (1.702 m)   Wt 164 lb 4 oz (74.5 kg)   LMP 10/05/2015   SpO2 98%   BMI 25.73 kg/m  Wt Readings from Last 3 Encounters:  10/25/15 164 lb 4 oz (74.5 kg)  07/16/15 177 lb 8 oz (80.5 kg)  12/06/14 171 lb 6.4 oz (77.7 kg)     Lab Results  Component Value Date   WBC 6.0 12/06/2014   HGB 12.6 12/06/2014   HCT 37.6 12/06/2014   PLT 208.0 12/06/2014   GLUCOSE 78 12/06/2014   CHOL 159 12/06/2014   TRIG 45.0 12/06/2014   HDL 61.80 12/06/2014   LDLCALC 88 12/06/2014   ALT 28 12/06/2014   AST 28 12/06/2014   NA 139 12/06/2014   K 4.1 12/06/2014   CL 105 12/06/2014   CREATININE 0.73 12/06/2014   BUN 8 12/06/2014   CO2 29 12/06/2014   TSH 0.88 12/06/2014   HGBA1C 4.9 09/08/2013    Lab Results  Component Value Date   TSH 0.88 12/06/2014   Lab Results  Component Value Date   WBC 6.0 12/06/2014   HGB 12.6 12/06/2014   HCT 37.6 12/06/2014   MCV 88.3 12/06/2014   PLT 208.0 12/06/2014   Lab Results  Component Value Date   NA 139 12/06/2014   K 4.1 12/06/2014   CO2 29 12/06/2014   GLUCOSE 78 12/06/2014   BUN 8 12/06/2014   CREATININE 0.73 12/06/2014   BILITOT 0.6 12/06/2014   ALKPHOS 88 12/06/2014   AST 28 12/06/2014   ALT 28 12/06/2014   PROT 7.9 12/06/2014   ALBUMIN 4.0 12/06/2014   CALCIUM 9.5 12/06/2014   GFR 110.81 12/06/2014   Lab Results  Component Value Date   CHOL 159 12/06/2014   Lab Results  Component Value Date   HDL 61.80 12/06/2014   Lab Results  Component Value Date   LDLCALC 88 12/06/2014    Lab Results  Component Value Date   TRIG 45.0 12/06/2014   Lab Results  Component Value Date   CHOLHDL 3 12/06/2014   Lab Results  Component Value Date   HGBA1C 4.9 09/08/2013       Assessment & Plan:   Problem List Items Addressed This Visit    Depression with anxiety    Uses  Alprazolam very infrequently. No changes at this time.  Overweight (BMI 25.0-29.9)    Encouraged DASH diet, decrease po intake and increase exercise as tolerated. Needs 7-8 hours of sleep nightly. Avoid trans fats, eat small, frequent meals every 4-5 hours with lean proteins, complex carbs and healthy fats. Minimize simple carbs      Elevated BP    Well controlled. Encouraged heart healthy diet such as the DASH diet and exercise as tolerated.        Other Visit Diagnoses   None.     I am having Ms. Mihok maintain her SUMAtriptan, acyclovir, butalbital-acetaminophen-caffeine, ALPRAZolam, and phentermine.  Meds ordered this encounter  Medications  . phentermine 15 MG capsule    Sig: Take 1 capsule (15 mg total) by mouth daily.    Dispense:  30 capsule    Refill:  2     Penni Homans, MD

## 2015-11-10 NOTE — Assessment & Plan Note (Signed)
Encouraged DASH diet, decrease po intake and increase exercise as tolerated. Needs 7-8 hours of sleep nightly. Avoid trans fats, eat small, frequent meals every 4-5 hours with lean proteins, complex carbs and healthy fats. Minimize simple carbs 

## 2015-12-05 ENCOUNTER — Encounter: Payer: Self-pay | Admitting: Obstetrics & Gynecology

## 2016-01-15 ENCOUNTER — Encounter: Payer: Self-pay | Admitting: Family Medicine

## 2016-01-23 ENCOUNTER — Encounter: Payer: Self-pay | Admitting: Obstetrics & Gynecology

## 2016-02-04 ENCOUNTER — Encounter: Payer: Self-pay | Admitting: Family Medicine

## 2016-02-06 ENCOUNTER — Encounter: Payer: Self-pay | Admitting: Family Medicine

## 2016-02-06 ENCOUNTER — Ambulatory Visit (INDEPENDENT_AMBULATORY_CARE_PROVIDER_SITE_OTHER): Payer: BLUE CROSS/BLUE SHIELD | Admitting: Family Medicine

## 2016-02-06 DIAGNOSIS — D709 Neutropenia, unspecified: Secondary | ICD-10-CM | POA: Diagnosis not present

## 2016-02-06 DIAGNOSIS — R030 Elevated blood-pressure reading, without diagnosis of hypertension: Secondary | ICD-10-CM

## 2016-02-06 DIAGNOSIS — G43809 Other migraine, not intractable, without status migrainosus: Secondary | ICD-10-CM | POA: Diagnosis not present

## 2016-02-06 DIAGNOSIS — E663 Overweight: Secondary | ICD-10-CM

## 2016-02-06 DIAGNOSIS — E049 Nontoxic goiter, unspecified: Secondary | ICD-10-CM

## 2016-02-06 LAB — LIPID PANEL
CHOLESTEROL: 152 mg/dL (ref 0–200)
HDL: 53.4 mg/dL (ref >39.00)
LDL CALC: 90 mg/dL (ref 0–99)
NonHDL: 98.89
Total CHOL/HDL Ratio: 3
Triglycerides: 44 mg/dL (ref 0.0–149.0)
VLDL: 8.8 mg/dL (ref 0.0–40.0)

## 2016-02-06 LAB — COMPREHENSIVE METABOLIC PANEL
ALK PHOS: 75 U/L (ref 39–117)
ALT: 12 U/L (ref 0–35)
AST: 14 U/L (ref 0–37)
Albumin: 4.3 g/dL (ref 3.5–5.2)
BUN: 9 mg/dL (ref 6–23)
CHLORIDE: 105 meq/L (ref 96–112)
CO2: 27 mEq/L (ref 19–32)
Calcium: 9.4 mg/dL (ref 8.4–10.5)
Creatinine, Ser: 0.7 mg/dL (ref 0.40–1.20)
GFR: 115.71 mL/min (ref >60.00)
Glucose, Bld: 84 mg/dL (ref 70–99)
Potassium: 4 mEq/L (ref 3.5–5.1)
Sodium: 140 mEq/L (ref 135–145)
TOTAL PROTEIN: 7.9 g/dL (ref 6.0–8.3)
Total Bilirubin: 0.6 mg/dL (ref 0.2–1.2)

## 2016-02-06 LAB — CBC
HCT: 37.6 % (ref 36.0–46.0)
HEMOGLOBIN: 12.4 g/dL (ref 12.0–15.0)
MCHC: 33 g/dL (ref 30.0–36.0)
MCV: 87.8 fl (ref 78.0–100.0)
Platelets: 198 10*3/uL (ref 150.0–400.0)
RBC: 4.28 Mil/uL (ref 3.87–5.11)
RDW: 12.6 % (ref 11.5–15.5)
WBC: 5.5 10*3/uL (ref 4.0–10.5)

## 2016-02-06 LAB — TSH: TSH: 0.76 u[IU]/mL (ref 0.35–4.50)

## 2016-02-06 MED ORDER — ALPRAZOLAM 0.25 MG PO TABS: 0.2500 mg | ORAL_TABLET | Freq: Two times a day (BID) | ORAL | 2 refills | Status: DC | PRN

## 2016-02-06 NOTE — Assessment & Plan Note (Signed)
Check CBC today.  

## 2016-02-06 NOTE — Progress Notes (Signed)
Pre visit review using our clinic review tool, if applicable. No additional management support is needed unless otherwise documented below in the visit note. 

## 2016-02-06 NOTE — Patient Instructions (Signed)
DASH Eating Plan DASH stands for "Dietary Approaches to Stop Hypertension." The DASH eating plan is a healthy eating plan that has been shown to reduce high blood pressure (hypertension). Additional health benefits may include reducing the risk of type 2 diabetes mellitus, heart disease, and stroke. The DASH eating plan may also help with weight loss. What do I need to know about the DASH eating plan? For the DASH eating plan, you will follow these general guidelines:  Choose foods with less than 150 milligrams of sodium per serving (as listed on the food label).  Use salt-free seasonings or herbs instead of table salt or sea salt.  Check with your health care provider or pharmacist before using salt substitutes.  Eat lower-sodium products. These are often labeled as "low-sodium" or "no salt added."  Eat fresh foods. Avoid eating a lot of canned foods.  Eat more vegetables, fruits, and low-fat dairy products.  Choose whole grains. Look for the word "whole" as the first word in the ingredient list.  Choose fish and skinless chicken or turkey more often than red meat. Limit fish, poultry, and meat to 6 oz (170 g) each day.  Limit sweets, desserts, sugars, and sugary drinks.  Choose heart-healthy fats.  Eat more home-cooked food and less restaurant, buffet, and fast food.  Limit fried foods.  Do not fry foods. Cook foods using methods such as baking, boiling, grilling, and broiling instead.  When eating at a restaurant, ask that your food be prepared with less salt, or no salt if possible. What foods can I eat? Seek help from a dietitian for individual calorie needs. Grains  Whole grain or whole wheat bread. Brown rice. Whole grain or whole wheat pasta. Quinoa, bulgur, and whole grain cereals. Low-sodium cereals. Corn or whole wheat flour tortillas. Whole grain cornbread. Whole grain crackers. Low-sodium crackers. Vegetables  Fresh or frozen vegetables (raw, steamed, roasted, or  grilled). Low-sodium or reduced-sodium tomato and vegetable juices. Low-sodium or reduced-sodium tomato sauce and paste. Low-sodium or reduced-sodium canned vegetables. Fruits  All fresh, canned (in natural juice), or frozen fruits. Meat and Other Protein Products  Ground beef (85% or leaner), grass-fed beef, or beef trimmed of fat. Skinless chicken or turkey. Ground chicken or turkey. Pork trimmed of fat. All fish and seafood. Eggs. Dried beans, peas, or lentils. Unsalted nuts and seeds. Unsalted canned beans. Dairy  Low-fat dairy products, such as skim or 1% milk, 2% or reduced-fat cheeses, low-fat ricotta or cottage cheese, or plain low-fat yogurt. Low-sodium or reduced-sodium cheeses. Fats and Oils  Tub margarines without trans fats. Light or reduced-fat mayonnaise and salad dressings (reduced sodium). Avocado. Safflower, olive, or canola oils. Natural peanut or almond butter. Other  Unsalted popcorn and pretzels. The items listed above may not be a complete list of recommended foods or beverages. Contact your dietitian for more options.  What foods are not recommended? Grains  White bread. White pasta. White rice. Refined cornbread. Bagels and croissants. Crackers that contain trans fat. Vegetables  Creamed or fried vegetables. Vegetables in a cheese sauce. Regular canned vegetables. Regular canned tomato sauce and paste. Regular tomato and vegetable juices. Fruits  Canned fruit in light or heavy syrup. Fruit juice. Meat and Other Protein Products  Fatty cuts of meat. Ribs, chicken wings, bacon, sausage, bologna, salami, chitterlings, fatback, hot dogs, bratwurst, and packaged luncheon meats. Salted nuts and seeds. Canned beans with salt. Dairy  Whole or 2% milk, cream, half-and-half, and cream cheese. Whole-fat or sweetened yogurt. Full-fat cheeses   or blue cheese. Nondairy creamers and whipped toppings. Processed cheese, cheese spreads, or cheese curds. Condiments  Onion and garlic  salt, seasoned salt, table salt, and sea salt. Canned and packaged gravies. Worcestershire sauce. Tartar sauce. Barbecue sauce. Teriyaki sauce. Soy sauce, including reduced sodium. Steak sauce. Fish sauce. Oyster sauce. Cocktail sauce. Horseradish. Ketchup and mustard. Meat flavorings and tenderizers. Bouillon cubes. Hot sauce. Tabasco sauce. Marinades. Taco seasonings. Relishes. Fats and Oils  Butter, stick margarine, lard, shortening, ghee, and bacon fat. Coconut, palm kernel, or palm oils. Regular salad dressings. Other  Pickles and olives. Salted popcorn and pretzels. The items listed above may not be a complete list of foods and beverages to avoid. Contact your dietitian for more information.  Where can I find more information? National Heart, Lung, and Blood Institute: www.nhlbi.nih.gov/health/health-topics/topics/dash/ This information is not intended to replace advice given to you by your health care provider. Make sure you discuss any questions you have with your health care provider. Document Released: 02/26/2011 Document Revised: 08/15/2015 Document Reviewed: 01/11/2013 Elsevier Interactive Patient Education  2017 Else[employer] Inc[employer]  

## 2016-02-06 NOTE — Assessment & Plan Note (Signed)
Check tSh today

## 2016-02-06 NOTE — Assessment & Plan Note (Signed)
Well controlled. Encouraged heart healthy diet such as the DASH diet and exercise as tolerated.  

## 2016-02-06 NOTE — Assessment & Plan Note (Signed)
Had a bad 2 day migraine once recently but this has not recurred and is not frequent, she will let us know if this recurs. Has N/V/photophobia with HAs. Encouraged increased hydration, 64 ounces of clear fluids daily. Minimize alcohol and caffeine. Eat small frequent meals with lean proteins and complex carbs. Avoid high and low blood sugars. Get adequate sleep, 7-8 hours a night. Needs exercise daily preferably in the morning.

## 2016-02-17 NOTE — Assessment & Plan Note (Signed)
Encouraged DASH diet, decrease po intake and increase exercise as tolerated. Needs 7-8 hours of sleep nightly. Avoid trans fats, eat small, frequent meals every 4-5 hours with lean proteins, complex carbs and healthy fats. Minimize simple carbs 

## 2016-02-17 NOTE — Progress Notes (Signed)
Patient ID: Lynn Dunlap, female   DOB: 27-Feb-1971, 45 y.o.   MRN: [id number]   Subjective:    Patient ID: Lynn Dunlap, female    DOB: Apr 23, 1970, 45 y.o.   MRN: [id number]  Chief Complaint  Patient presents with  . Follow-up    HPI Patient is in today for follow up on medications today. She feels well today, no recent illness or acute concenrs. Is generally doing well, trying to maintain a heart healthy diet and to exercise regularly. Denies CP/palp/SOB/HA/congestion/fevers/GI or GU c/o. Taking meds as prescribed  Past Medical History:  Diagnosis Date  . Anemia 01/17/2012  . Anxiety   . Cervical cancer screening 01/02/2011   Menarche at 28 G2P2 s/p 1 svd and 1 c/s, tubal No abnl paps or MGMs   . Depression 08/10/2010  . Depression with anxiety 08/10/2010  . Elevated BP 12/02/2012  . Headache(784.0)   . Neutropenia ([place]) 01/17/2012  . Overweight (BMI 25.0-29.9) 12/02/2012  . Preventative health care 01/02/2011    Past Surgical History:  Procedure Laterality Date  . CESAREAN SECTION     X 1 w/ tubal  . TUBAL LIGATION      Family History  Problem Relation Age of Onset  . Cancer Mother     sarcoma, gyn cancer w/ hysterectomy  . Hypertension Father   . Asthma Brother   . Cancer Maternal Grandfather     prostate  . Hyperlipidemia Paternal Grandmother   . Glaucoma Paternal Grandfather     Social History   Social History  . Marital status: Married    Spouse name: N/A  . Number of children: N/A  . Years of education: N/A   Occupational History  . Not on file.   Social History Main Topics  . Smoking status: Never Smoker  . Smokeless tobacco: Never Used  . Alcohol use No  . Drug use: No  . Sexual activity: Yes    Partners: Male     Comment: lives with husband and children, no dietary restrictions, works at [employer]   Other Topics Concern  . Not on file   Social History Narrative  . No narrative on file    Outpatient Medications Prior to Visit    Medication Sig Dispense Refill  . acyclovir (ZOVIRAX) 400 MG tablet Take 1 tablet (400 mg total) by mouth 5 (five) times daily. 35 tablet 0  . butalbital-acetaminophen-caffeine (FIORICET) 50-325-40 MG tablet Take 1 tablet by mouth every 6 (six) hours as needed. 60 tablet 1  . SUMAtriptan (IMITREX) 50 MG tablet Take 1 tablet (50 mg total) by mouth once. May repeat once in 2 hours if headache persists or recurs 10 tablet 0  . ALPRAZolam (XANAX) 0.25 MG tablet Take 1 tablet (0.25 mg total) by mouth 2 (two) times daily as needed for anxiety. 30 tablet 2  . phentermine 15 MG capsule Take 1 capsule (15 mg total) by mouth daily. 30 capsule 2   No facility-administered medications prior to visit.     Allergies  Allergen Reactions  . Doxycycline   . Erythromycin   . Sulfonamide Derivatives     Review of Systems  Constitutional: Negative for fever and malaise/fatigue.  HENT: Negative for congestion.   Eyes: Negative for blurred vision.  Respiratory: Negative for shortness of breath.   Cardiovascular: Negative for chest pain, palpitations and leg swelling.  Gastrointestinal: Negative for abdominal pain, blood in stool and nausea.  Genitourinary: Negative for dysuria and frequency.  Musculoskeletal: Negative for  falls.  Skin: Negative for rash.  Neurological: Negative for dizziness, loss of consciousness and headaches.  Endo/Heme/Allergies: Negative for environmental allergies.  Psychiatric/Behavioral: Negative for depression. The patient is not nervous/anxious.   All other systems reviewed and are negative.      Objective:    Physical Exam  Constitutional: She is oriented to person, place, and time. She appears well-developed and well-nourished. No distress.  HENT:  Head: Normocephalic and atraumatic.  Nose: Nose normal.  Eyes: Right eye exhibits no discharge. Left eye exhibits no discharge.  Neck: Normal range of motion. Neck supple.  Cardiovascular: Normal rate and regular rhythm.    No murmur heard. Pulmonary/Chest: Effort normal and breath sounds normal.  Abdominal: Soft. Bowel sounds are normal. There is no tenderness.  Musculoskeletal: She exhibits no edema.  Neurological: She is alert and oriented to person, place, and time.  Skin: Skin is warm and dry.  Psychiatric: She has a normal mood and affect.  Nursing note and vitals reviewed.   BP 128/86 (BP Location: Left Arm, Patient Position: Sitting, Cuff Size: Normal)   Pulse 83   Temp 98.1 F (36.7 C) (Oral)   Ht 5\' 7"  (1.702 m)   Wt 166 lb 2 oz (75.4 kg)   SpO2 99%   BMI 26.02 kg/m  Wt Readings from Last 3 Encounters:  02/06/16 166 lb 2 oz (75.4 kg)  10/25/15 164 lb 4 oz (74.5 kg)  07/16/15 177 lb 8 oz (80.5 kg)     Lab Results  Component Value Date   WBC 5.5 02/06/2016   HGB 12.4 02/06/2016   HCT 37.6 02/06/2016   PLT 198.0 02/06/2016   GLUCOSE 84 02/06/2016   CHOL 152 02/06/2016   TRIG 44.0 02/06/2016   HDL 53.40 02/06/2016   LDLCALC 90 02/06/2016   ALT 12 02/06/2016   AST 14 02/06/2016   NA 140 02/06/2016   K 4.0 02/06/2016   CL 105 02/06/2016   CREATININE 0.70 02/06/2016   BUN 9 02/06/2016   CO2 27 02/06/2016   TSH 0.76 02/06/2016   HGBA1C 4.9 09/08/2013    Lab Results  Component Value Date   TSH 0.76 02/06/2016   Lab Results  Component Value Date   WBC 5.5 02/06/2016   HGB 12.4 02/06/2016   HCT 37.6 02/06/2016   MCV 87.8 02/06/2016   PLT 198.0 02/06/2016   Lab Results  Component Value Date   NA 140 02/06/2016   K 4.0 02/06/2016   CO2 27 02/06/2016   GLUCOSE 84 02/06/2016   BUN 9 02/06/2016   CREATININE 0.70 02/06/2016   BILITOT 0.6 02/06/2016   ALKPHOS 75 02/06/2016   AST 14 02/06/2016   ALT 12 02/06/2016   PROT 7.9 02/06/2016   ALBUMIN 4.3 02/06/2016   CALCIUM 9.4 02/06/2016   GFR 115.71 02/06/2016   Lab Results  Component Value Date   CHOL 152 02/06/2016   Lab Results  Component Value Date   HDL 53.40 02/06/2016   Lab Results  Component Value  Date   LDLCALC 90 02/06/2016   Lab Results  Component Value Date   TRIG 44.0 02/06/2016   Lab Results  Component Value Date   CHOLHDL 3 02/06/2016   Lab Results  Component Value Date   HGBA1C 4.9 09/08/2013       Assessment & Plan:   Problem List Items Addressed This Visit    Goiter    Check tSh today      Migraine    Had a  bad 2 day migraine once recently but this has not recurred and is not frequent, she will let us know if this recurs. Has N/V/photophobia with HAs. Encouraged increased hydration, 64 ounces of clear fluids daily. Minimize alcohol and caffeine. Eat small frequent meals with lean proteins and complex carbs. Avoid high and low blood sugars. Get adequate sleep, 7-8 hours a night. Needs exercise daily preferably in the morning.      Neutropenia ([place])    Check CBC today      Relevant Orders   CBC (Completed)   Overweight (BMI 25.0-29.9)    Encouraged DASH diet, decrease po intake and increase exercise as tolerated. Needs 7-8 hours of sleep nightly. Avoid trans fats, eat small, frequent meals every 4-5 hours with lean proteins, complex carbs and healthy fats. Minimize simple carbs      Elevated blood pressure reading    Well controlled. Encouraged heart healthy diet such as the DASH diet and exercise as tolerated.       Relevant Orders   Comprehensive metabolic panel (Completed)   Lipid panel (Completed)   TSH (Completed)      I have discontinued Ms. Sarabia's phentermine. I am also having her maintain her SUMAtriptan, acyclovir, butalbital-acetaminophen-caffeine, and ALPRAZolam.  Meds ordered this encounter  Medications  . ALPRAZolam (XANAX) 0.25 MG tablet    Sig: Take 1 tablet (0.25 mg total) by mouth 2 (two) times daily as needed for anxiety.    Dispense:  30 tablet    Refill:  2     Penni Homans, MD

## 2016-02-23 ENCOUNTER — Encounter: Payer: Self-pay | Admitting: Family Medicine

## 2016-03-21 ENCOUNTER — Encounter: Payer: Self-pay | Admitting: Family Medicine

## 2016-03-27 ENCOUNTER — Other Ambulatory Visit (HOSPITAL_COMMUNITY)
Admission: RE | Admit: 2016-03-27 | Discharge: 2016-03-27 | Disposition: A | Discharge status: Home / Self Care | Payer: BLUE CROSS/BLUE SHIELD | Source: Ambulatory Visit | Attending: Family Medicine | Admitting: Family Medicine

## 2016-03-27 ENCOUNTER — Ambulatory Visit (INDEPENDENT_AMBULATORY_CARE_PROVIDER_SITE_OTHER): Payer: BLUE CROSS/BLUE SHIELD | Admitting: Family Medicine

## 2016-03-27 VITALS — BP 118/80 | HR 80 | Temp 98.0°F | Ht 67.0 in | Wt 163.4 lb

## 2016-03-27 DIAGNOSIS — Z01419 Encounter for gynecological examination (general) (routine) without abnormal findings: Secondary | ICD-10-CM | POA: Diagnosis present

## 2016-03-27 DIAGNOSIS — G43809 Other migraine, not intractable, without status migrainosus: Secondary | ICD-10-CM

## 2016-03-27 DIAGNOSIS — E049 Nontoxic goiter, unspecified: Secondary | ICD-10-CM

## 2016-03-27 DIAGNOSIS — Z124 Encounter for screening for malignant neoplasm of cervix: Secondary | ICD-10-CM | POA: Diagnosis not present

## 2016-03-27 DIAGNOSIS — Z0000 Encounter for general adult medical examination without abnormal findings: Secondary | ICD-10-CM

## 2016-03-27 NOTE — Assessment & Plan Note (Signed)
Encouraged increased hydration, 64 ounces of clear fluids daily. Minimize alcohol and caffeine. Eat small frequent meals with lean proteins and complex carbs. Avoid high and low blood sugars. Doing well no recent flares

## 2016-03-27 NOTE — Progress Notes (Signed)
Patient ID: Lynn Dunlap, female   DOB: 09/28/1970, 46 y.o.   MRN: [id number]

## 2016-03-27 NOTE — Progress Notes (Signed)
Pre visit review using our clinic review tool, if applicable. No additional management support is needed unless otherwise documented below in the visit note. 

## 2016-03-27 NOTE — Assessment & Plan Note (Signed)
Patient encouraged to maintain heart healthy diet, regular exercise, adequate sleep. Consider daily probiotics. Take medications as prescribed. Given and reviewed copy of ACP documents from [place] Secretary of State and encouraged to complete and return 

## 2016-03-27 NOTE — Patient Instructions (Signed)
Spoke with Patient about the benefits of having Advanced Directives in place and asked that Patient provide the Office a copy. Preventive Care 40-64 Years, Female Preventive care refers to lifestyle choices and visits with your health care provider that can promote health and wellness. What does preventive care include?  A yearly physical exam. This is also called an annual well check.  Dental exams once or twice a year.  Routine eye exams. Ask your health care provider how often you should have your eyes checked.  Personal lifestyle choices, including:  Daily care of your teeth and gums.  Regular physical activity.  Eating a healthy diet.  Avoiding tobacco and drug use.  Limiting alcohol use.  Practicing safe sex.  Taking low-dose aspirin daily starting at age 50.  Taking vitamin and mineral supplements as recommended by your health care provider. What happens during an annual well check? The services and screenings done by your health care provider during your annual well check will depend on your age, overall health, lifestyle risk factors, and family history of disease. Counseling  Your health care provider may ask you questions about your:  Alcohol use.  Tobacco use.  Drug use.  Emotional well-being.  Home and relationship well-being.  Sexual activity.  Eating habits.  Work and work environment.  Method of birth control.  Menstrual cycle.  Pregnancy history. Screening  You may have the following tests or measurements:  Height, weight, and BMI.  Blood pressure.  Lipid and cholesterol levels. These may be checked every 5 years, or more frequently if you are over 50 years old.  Skin check.  Lung cancer screening. You may have this screening every year starting at age 55 if you have a 30-pack-year history of smoking and currently smoke or have quit within the past 15 years.  Fecal occult blood test (FOBT) of the stool. You may have this test every  year starting at age 50.  Flexible sigmoidoscopy or colonoscopy. You may have a sigmoidoscopy every 5 years or a colonoscopy every 10 years starting at age 50.  Hepatitis C blood test.  Hepatitis B blood test.  Sexually transmitted disease (STD) testing.  Diabetes screening. This is done by checking your blood sugar (glucose) after you have not eaten for a while (fasting). You may have this done every 1-3 years.  Mammogram. This may be done every 1-2 years. Talk to your health care provider about when you should start having regular mammograms. This may depend on whether you have a family history of breast cancer.  BRCA-related cancer screening. This may be done if you have a family history of breast, ovarian, tubal, or peritoneal cancers.  Pelvic exam and Pap test. This may be done every 3 years starting at age 21. Starting at age 30, this may be done every 5 years if you have a Pap test in combination with an HPV test.  Bone density scan. This is done to screen for osteoporosis. You may have this scan if you are at high risk for osteoporosis. Discuss your test results, treatment options, and if necessary, the need for more tests with your health care provider. Vaccines  Your health care provider may recommend certain vaccines, such as:  Influenza vaccine. This is recommended every year.  Tetanus, diphtheria, and acellular pertussis (Tdap, Td) vaccine. You may need a Td booster every 10 years.  Varicella vaccine. You may need this if you have not been vaccinated.  Zoster vaccine. You may need this after age   60.  Measles, mumps, and rubella (MMR) vaccine. You may need at least one dose of MMR if you were born in 1957 or later. You may also need a second dose.  Pneumococcal 13-valent conjugate (PCV13) vaccine. You may need this if you have certain conditions and were not previously vaccinated.  Pneumococcal polysaccharide (PPSV23) vaccine. You may need one or two doses if you smoke  cigarettes or if you have certain conditions.  Meningococcal vaccine. You may need this if you have certain conditions.  Hepatitis A vaccine. You may need this if you have certain conditions or if you travel or work in places where you may be exposed to hepatitis A.  Hepatitis B vaccine. You may need this if you have certain conditions or if you travel or work in places where you may be exposed to hepatitis B.  Haemophilus influenzae type b (Hib) vaccine. You may need this if you have certain conditions. Talk to your health care provider about which screenings and vaccines you need and how often you need them. This information is not intended to replace advice given to you by your health care provider. Make sure you discuss any questions you have with your health care provider. Document Released: 04/05/2015 Document Revised: 11/27/2015 Document Reviewed: 01/08/2015 Elsevier Interactive Patient Education  2017 Elsevier Inc.  

## 2016-03-27 NOTE — Assessment & Plan Note (Signed)
Left > right on palpation. Last ultrasound back in 2009 will repeat ultrasound today

## 2016-03-27 NOTE — Progress Notes (Signed)
Subjective:    Patient ID: Lynn Dunlap, female    DOB: 11/25/1970, 46 y.o.   MRN: [id number]  Chief Complaint  Patient presents with  . Gynecologic Exam    HPI Patient is in today for gynecological examination with pap smear. No acute concerns noted. No recent illness or hospitalizations. She is trying to maintain a heart healthy diet and is exercising regularly. Denies CP/palp/SOB/HA/congestion/fevers/GI or GU c/o. Taking meds as prescribed  Past Medical History:  Diagnosis Date  . Anemia 01/17/2012  . Anxiety   . Cervical cancer screening 01/02/2011   Menarche at 75 G2P2 s/p 1 svd and 1 c/s, tubal No abnl paps or MGMs   . Depression 08/10/2010  . Depression with anxiety 08/10/2010  . Elevated BP 12/02/2012  . Headache(784.0)   . Neutropenia ([place]) 01/17/2012  . Overweight (BMI 25.0-29.9) 12/02/2012  . Preventative health care 01/02/2011    Past Surgical History:  Procedure Laterality Date  . CESAREAN SECTION     X 1 w/ tubal  . TUBAL LIGATION      Family History  Problem Relation Age of Onset  . Cancer Mother     sarcoma, gyn cancer w/ hysterectomy  . Hypertension Father   . Asthma Brother   . Cancer Maternal Grandfather     prostate  . Hyperlipidemia Paternal Grandmother   . Glaucoma Paternal Grandfather     Social History   Social History  . Marital status: Married    Spouse name: N/A  . Number of children: N/A  . Years of education: N/A   Occupational History  . Not on file.   Social History Main Topics  . Smoking status: Never Smoker  . Smokeless tobacco: Never Used  . Alcohol use No  . Drug use: No  . Sexual activity: Yes    Partners: Male     Comment: lives with husband and children, no dietary restrictions, works at [employer]   Other Topics Concern  . Not on file   Social History Narrative  . No narrative on file    Outpatient Medications Prior to Visit  Medication Sig Dispense Refill  . acyclovir (ZOVIRAX) 400 MG tablet Take 1  tablet (400 mg total) by mouth 5 (five) times daily. 35 tablet 0  . ALPRAZolam (XANAX) 0.25 MG tablet Take 1 tablet (0.25 mg total) by mouth 2 (two) times daily as needed for anxiety. 30 tablet 2  . butalbital-acetaminophen-caffeine (FIORICET) 50-325-40 MG tablet Take 1 tablet by mouth every 6 (six) hours as needed. 60 tablet 1  . SUMAtriptan (IMITREX) 50 MG tablet Take 1 tablet (50 mg total) by mouth once. May repeat once in 2 hours if headache persists or recurs 10 tablet 0   No facility-administered medications prior to visit.     Allergies  Allergen Reactions  . Doxycycline   . Erythromycin   . Sulfonamide Derivatives     Review of Systems  Constitutional: Negative for fever.  HENT: Negative for congestion.   Eyes: Negative for blurred vision and double vision.  Respiratory: Negative for cough.   Cardiovascular: Negative for chest pain and palpitations.  Gastrointestinal: Negative for vomiting.  Musculoskeletal: Negative for back pain.  Skin: Negative for rash.  Neurological: Positive for headaches. Negative for loss of consciousness.       Infrequent and responds to meds       Objective:    Physical Exam  Constitutional: She is oriented to person, place, and time. She appears well-developed and  well-nourished. No distress.  HENT:  Head: Normocephalic and atraumatic.  Eyes: Conjunctivae are normal.  Neck: Normal range of motion. No thyromegaly present.  Cardiovascular: Normal rate and regular rhythm.   Pulmonary/Chest: Effort normal and breath sounds normal. She has no wheezes.  Abdominal: Soft. Bowel sounds are normal. There is no tenderness.  Genitourinary: Vagina normal and uterus normal. No vaginal discharge found.  Musculoskeletal: She exhibits no edema or deformity.  Neurological: She is alert and oriented to person, place, and time.  Skin: Skin is warm and dry. She is not diaphoretic.  Psychiatric: She has a normal mood and affect.    BP 118/80 (BP Location:  Right Arm, Patient Position: Sitting, Cuff Size: Normal)   Pulse 80   Temp 98 F (36.7 C) (Oral)   Ht 5\' 7"  (1.702 m)   Wt 163 lb 6.4 oz (74.1 kg)   LMP 03/06/2016 (Approximate)   SpO2 99% Comment: RA  BMI 25.59 kg/m  Wt Readings from Last 3 Encounters:  03/27/16 163 lb 6.4 oz (74.1 kg)  02/06/16 166 lb 2 oz (75.4 kg)  10/25/15 164 lb 4 oz (74.5 kg)     Lab Results  Component Value Date   WBC 5.5 02/06/2016   HGB 12.4 02/06/2016   HCT 37.6 02/06/2016   PLT 198.0 02/06/2016   GLUCOSE 84 02/06/2016   CHOL 152 02/06/2016   TRIG 44.0 02/06/2016   HDL 53.40 02/06/2016   LDLCALC 90 02/06/2016   ALT 12 02/06/2016   AST 14 02/06/2016   NA 140 02/06/2016   K 4.0 02/06/2016   CL 105 02/06/2016   CREATININE 0.70 02/06/2016   BUN 9 02/06/2016   CO2 27 02/06/2016   TSH 0.76 02/06/2016   HGBA1C 4.9 09/08/2013    Lab Results  Component Value Date   TSH 0.76 02/06/2016   Lab Results  Component Value Date   WBC 5.5 02/06/2016   HGB 12.4 02/06/2016   HCT 37.6 02/06/2016   MCV 87.8 02/06/2016   PLT 198.0 02/06/2016   Lab Results  Component Value Date   NA 140 02/06/2016   K 4.0 02/06/2016   CO2 27 02/06/2016   GLUCOSE 84 02/06/2016   BUN 9 02/06/2016   CREATININE 0.70 02/06/2016   BILITOT 0.6 02/06/2016   ALKPHOS 75 02/06/2016   AST 14 02/06/2016   ALT 12 02/06/2016   PROT 7.9 02/06/2016   ALBUMIN 4.3 02/06/2016   CALCIUM 9.4 02/06/2016   GFR 115.71 02/06/2016   Lab Results  Component Value Date   CHOL 152 02/06/2016   Lab Results  Component Value Date   HDL 53.40 02/06/2016   Lab Results  Component Value Date   LDLCALC 90 02/06/2016   Lab Results  Component Value Date   TRIG 44.0 02/06/2016   Lab Results  Component Value Date   CHOLHDL 3 02/06/2016   Lab Results  Component Value Date   HGBA1C 4.9 09/08/2013      I acted as a Education administrator for Dr. Charlett Blake. Raiford Noble, RMA  Assessment & Plan:   Problem List Items Addressed This Visit    Goiter     Left > right on palpation. Last ultrasound back in 2009 will repeat ultrasound today      Relevant Orders   US SOFT TISSUE HEAD AND NECK   Migraine    Encouraged increased hydration, 64 ounces of clear fluids daily. Minimize alcohol and caffeine. Eat small frequent meals with lean proteins and complex carbs. Avoid high and  low blood sugars. Doing well no recent flares      Cervical cancer screening - Primary    Pap today, no concerns on exam.       Relevant Orders   Cytology - PAP   Preventative health care    Patient encouraged to maintain heart healthy diet, regular exercise, adequate sleep. Consider daily probiotics. Take medications as prescribed. Given and reviewed copy of ACP documents from [facility] Secretary of State and encouraged to complete and return         I am having Ms. Dysart maintain her SUMAtriptan, acyclovir, butalbital-acetaminophen-caffeine, and ALPRAZolam.  No orders of the defined types were placed in this encounter.  CMA served as Education administrator during this visit. History, Physical and Plan performed by medical provider. Documentation and orders reviewed and attested to.  Penni Homans, MD

## 2016-03-27 NOTE — Assessment & Plan Note (Signed)
Pap today, no concerns on exam.  

## 2016-03-30 LAB — CYTOLOGY - PAP: Diagnosis: NEGATIVE

## 2016-04-01 ENCOUNTER — Ambulatory Visit (HOSPITAL_BASED_OUTPATIENT_CLINIC_OR_DEPARTMENT_OTHER): Payer: BLUE CROSS/BLUE SHIELD

## 2016-04-10 ENCOUNTER — Encounter: Payer: Self-pay | Admitting: Family Medicine

## 2016-04-10 ENCOUNTER — Ambulatory Visit (HOSPITAL_BASED_OUTPATIENT_CLINIC_OR_DEPARTMENT_OTHER)
Admission: RE | Admit: 2016-04-10 | Discharge: 2016-04-10 | Disposition: A | Discharge status: Home / Self Care | Payer: BLUE CROSS/BLUE SHIELD | Source: Ambulatory Visit | Attending: Family Medicine | Admitting: Family Medicine

## 2016-04-10 DIAGNOSIS — E049 Nontoxic goiter, unspecified: Secondary | ICD-10-CM | POA: Diagnosis present

## 2016-04-10 DIAGNOSIS — E236 Other disorders of pituitary gland: Secondary | ICD-10-CM | POA: Diagnosis not present

## 2016-05-21 ENCOUNTER — Encounter: Payer: Self-pay | Admitting: Family Medicine

## 2016-05-22 ENCOUNTER — Other Ambulatory Visit: Payer: Self-pay | Admitting: Family Medicine

## 2016-05-22 MED ORDER — PHENTERMINE HCL 37.5 MG PO CAPS: 37.5000 mg | ORAL_CAPSULE | ORAL | 0 refills | Status: AC

## 2016-08-18 ENCOUNTER — Other Ambulatory Visit: Payer: Self-pay | Admitting: Family Medicine

## 2016-08-18 NOTE — Telephone Encounter (Signed)
Requesting: ALPRAZolam (XANAX) 0.25 MG tablet Contract: controlled substance contract signed 3/17/16UDS Last OV: uds sample given,Low risk, next screen 12/08/14.  Last Refill: 02/06/16  Please Advise

## 2016-08-18 NOTE — Telephone Encounter (Signed)
Called Patient and advised her that she need to come in the Office to give a urine specimen and sign an updated controlled substance contract. Patient verbalized understanding.

## 2016-08-18 NOTE — Telephone Encounter (Signed)
I have printed but she needs updated UDS and contract

## 2016-09-29 ENCOUNTER — Ambulatory Visit: Payer: BLUE CROSS/BLUE SHIELD | Admitting: Family Medicine

## 2016-11-26 ENCOUNTER — Ambulatory Visit: Payer: Self-pay | Admitting: Family Medicine

## 2017-02-08 ENCOUNTER — Ambulatory Visit: Payer: Self-pay | Admitting: Family Medicine

## 2017-03-12 ENCOUNTER — Telehealth: Payer: Self-pay | Admitting: Family Medicine

## 2017-03-12 NOTE — Telephone Encounter (Signed)
Rx for XANAX dated 08/18/2016 was not picked up. Document was shredded.

## 2017-03-14 ENCOUNTER — Encounter: Payer: Self-pay | Admitting: Family Medicine

## 2017-03-14 ENCOUNTER — Other Ambulatory Visit: Payer: Self-pay | Admitting: Family Medicine

## 2017-03-15 MED ORDER — ACYCLOVIR 400 MG PO TABS: 400.0000 mg | ORAL_TABLET | Freq: Every day | ORAL | 0 refills | Status: AC

## 2017-04-15 ENCOUNTER — Ambulatory Visit: Payer: Self-pay | Admitting: Family Medicine

## 2017-05-27 ENCOUNTER — Ambulatory Visit: Payer: Self-pay | Admitting: Family Medicine

## 2017-06-28 ENCOUNTER — Ambulatory Visit: Payer: Self-pay | Admitting: Family Medicine

## 2017-07-23 ENCOUNTER — Encounter (INDEPENDENT_AMBULATORY_CARE_PROVIDER_SITE_OTHER): Payer: Self-pay

## 2017-07-23 NOTE — Telephone Encounter (Signed)
Copied from [place] [id number]. Topic: Quick Communication - See Telephone Encounter >> Jul 23, 2017  4:05 PM Genella Rife H wrote: CRM for notification. See Telephone encounter for: 07/23/17.  Tried to leave voicemail message her appt needs to be rescheduled per the pcp. Her mailbox was full. Sent a myChart message to pt to call us or reschedule using myChart.

## 2017-08-20 ENCOUNTER — Ambulatory Visit: Payer: Self-pay | Admitting: Family Medicine

## 2017-09-20 ENCOUNTER — Ambulatory Visit: Payer: Self-pay | Admitting: Family Medicine

## 2018-01-18 IMAGING — US US SOFT TISSUE HEAD/NECK
1 series · 14 of 20 positions shown · non-contrast
Comparison: 08/04/2007

CLINICAL DATA: Goiter.  Thyroid enlargement palpated by physician.

EXAM:
THYROID ULTRASOUND
TECHNIQUE: Ultrasound examination of the thyroid gland and adjacent soft
tissues was performed.

[Series 1: us soft tissue head/neck · 0.05mm/px · 14 of 20 slices shown]
[im 1/20]
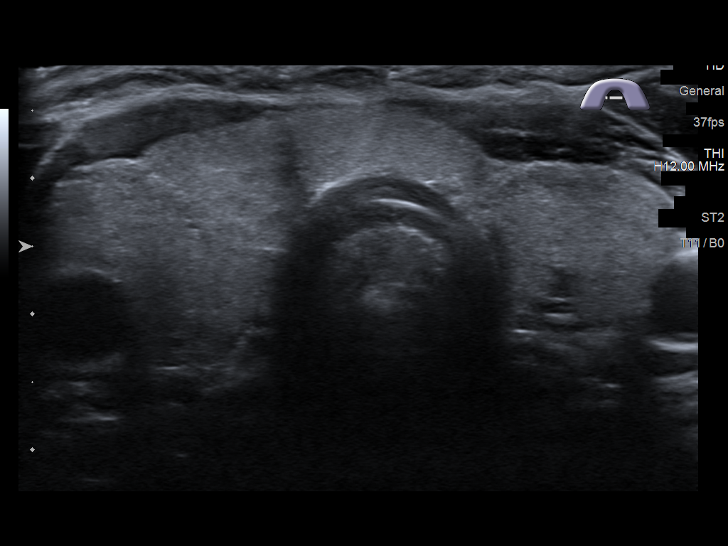
[im 3/20]
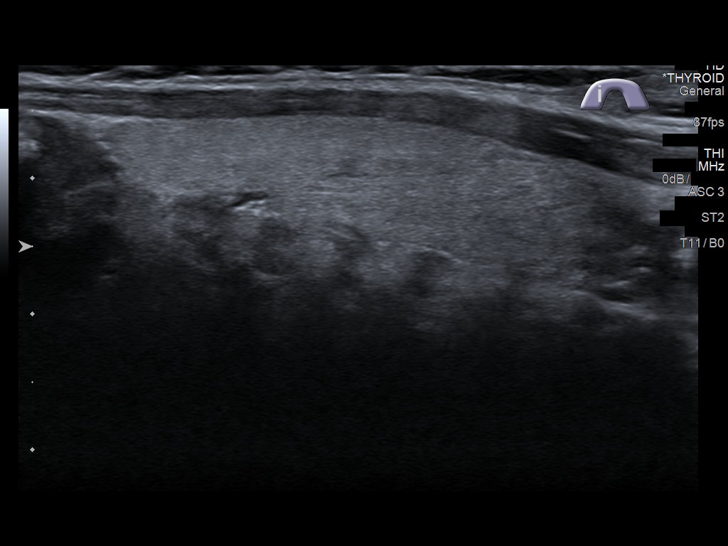
[im 4/20]
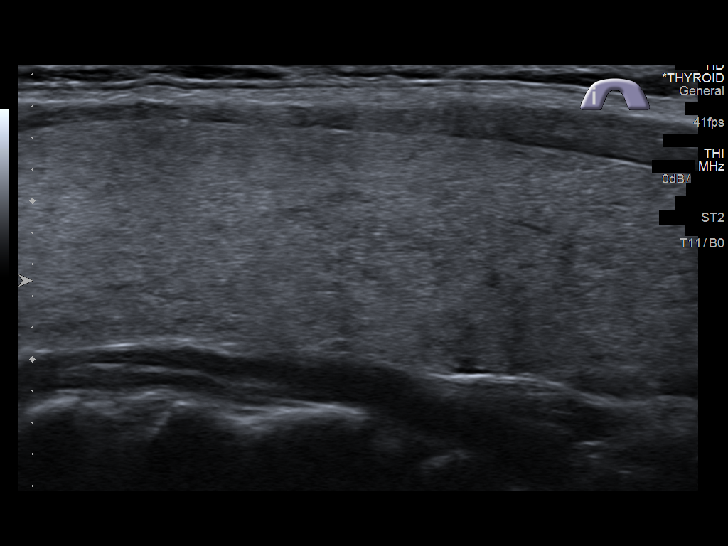
[im 6/20]
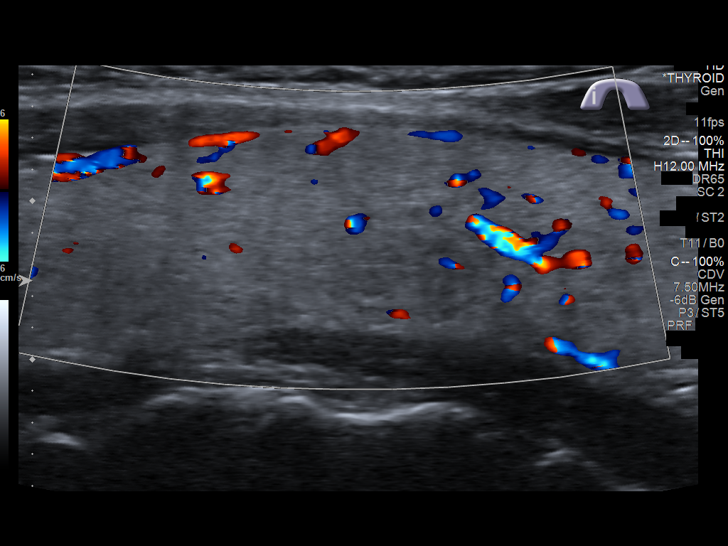
[im 7/20]
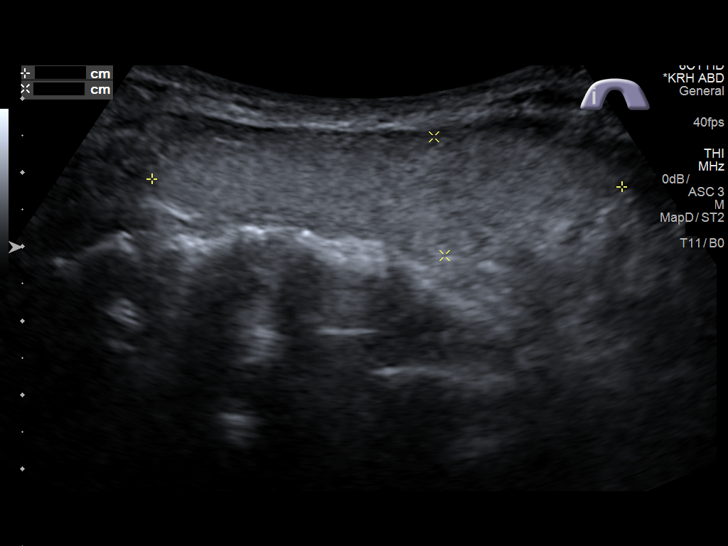
[im 8/20]
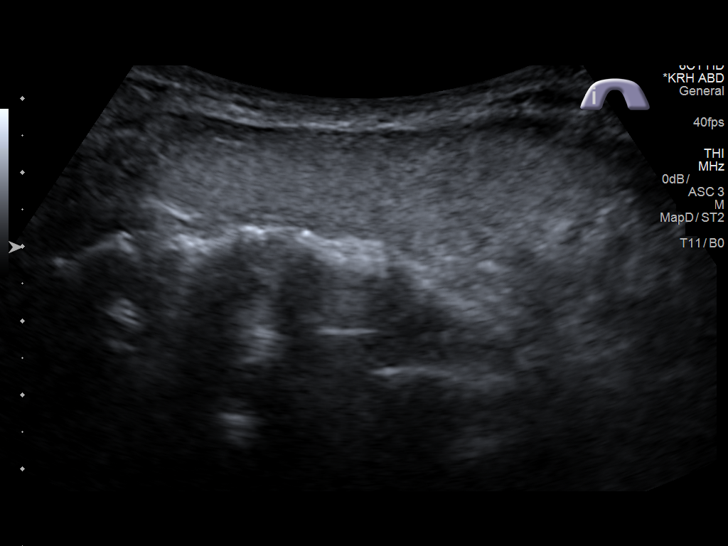
[im 10/20]
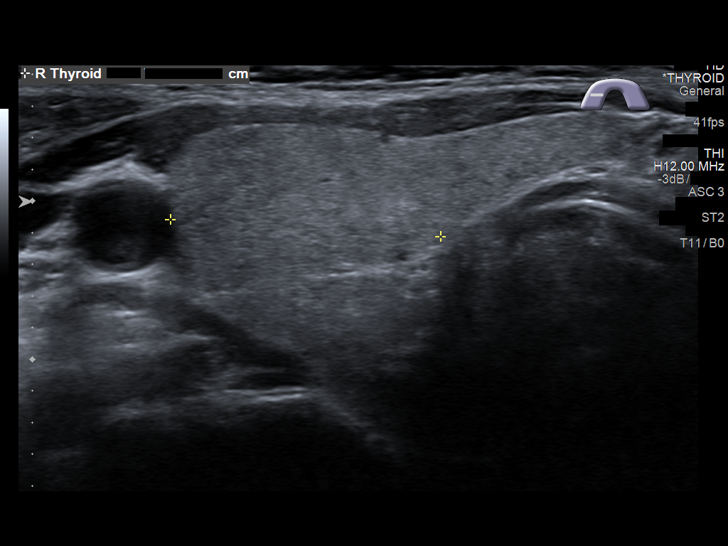
[im 11/20]
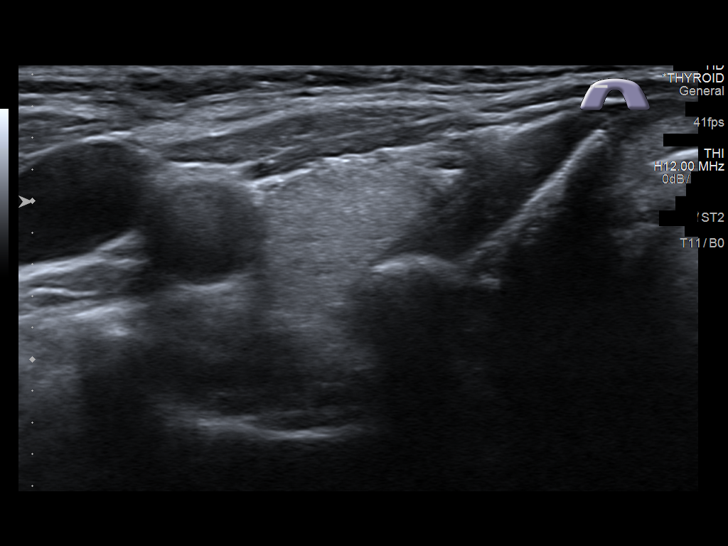
[im 13/20]
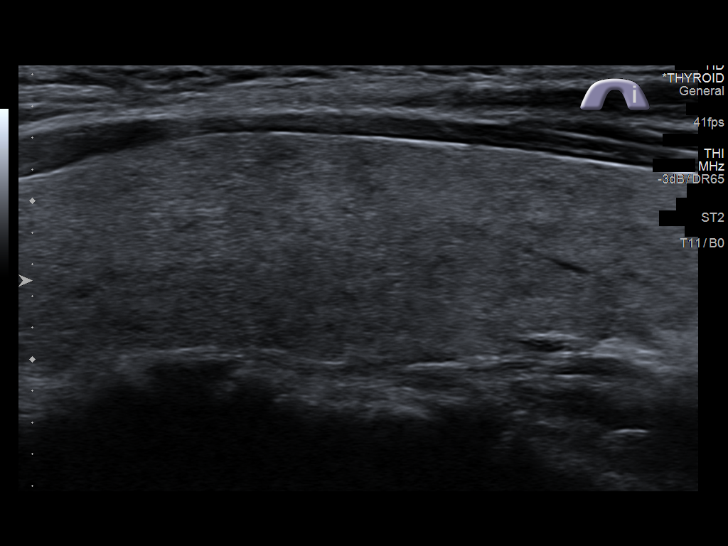
[im 14/20]
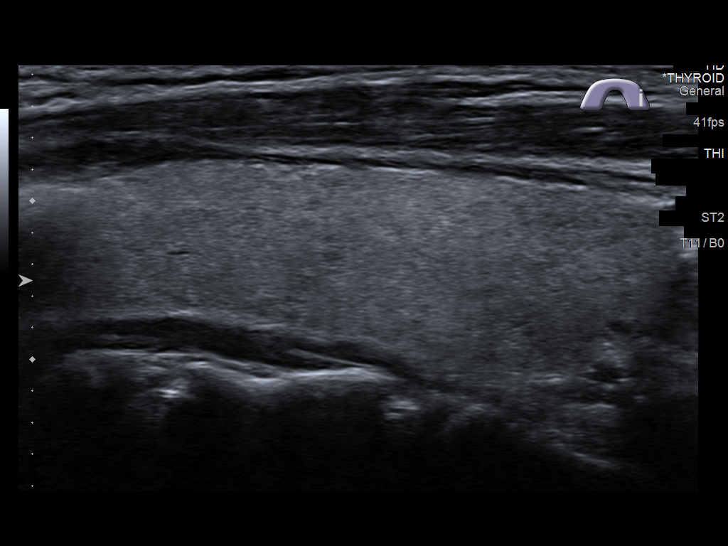
[im 16/20]
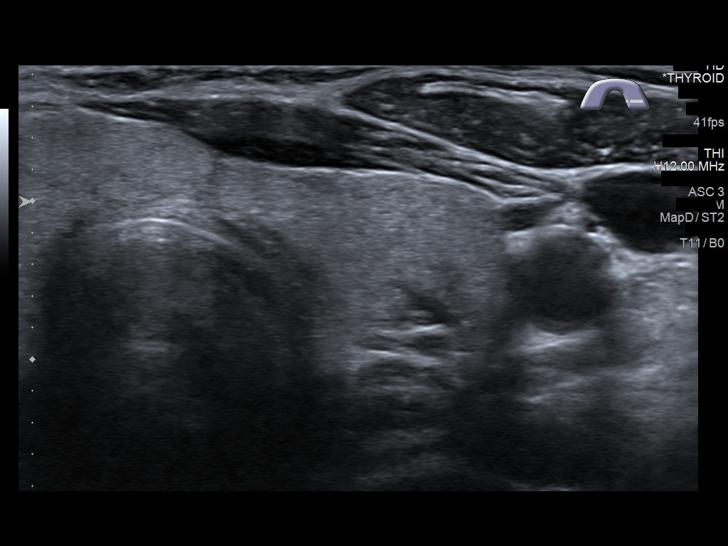
[im 17/20]
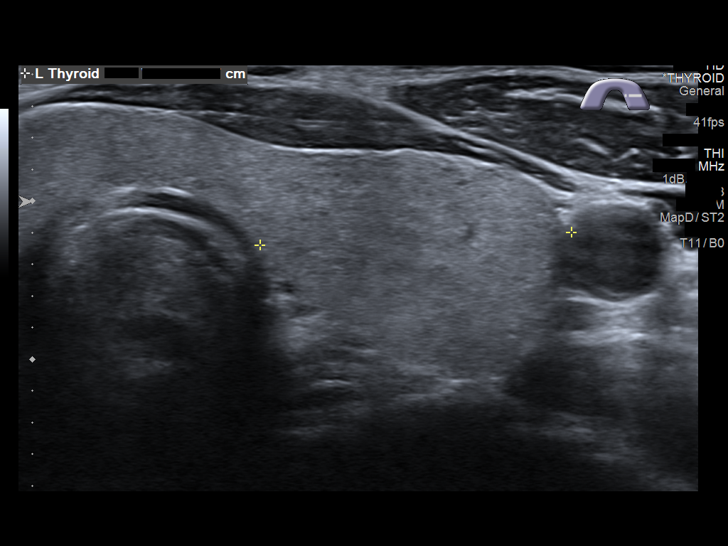
[im 18/20]
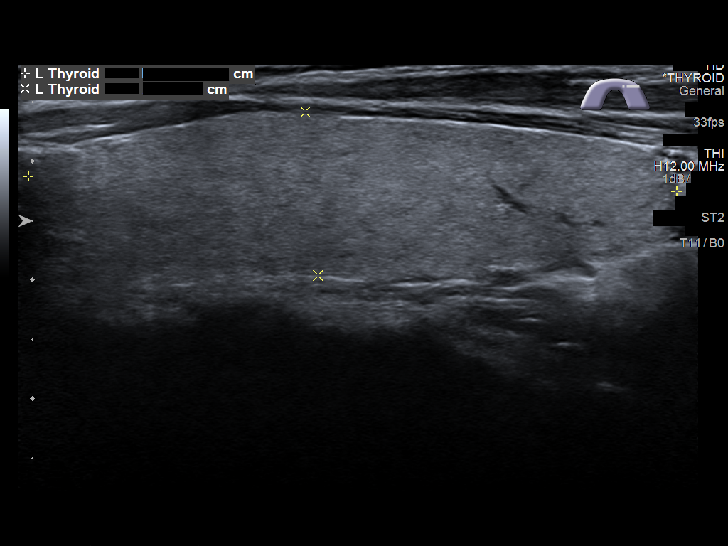
[im 20/20]
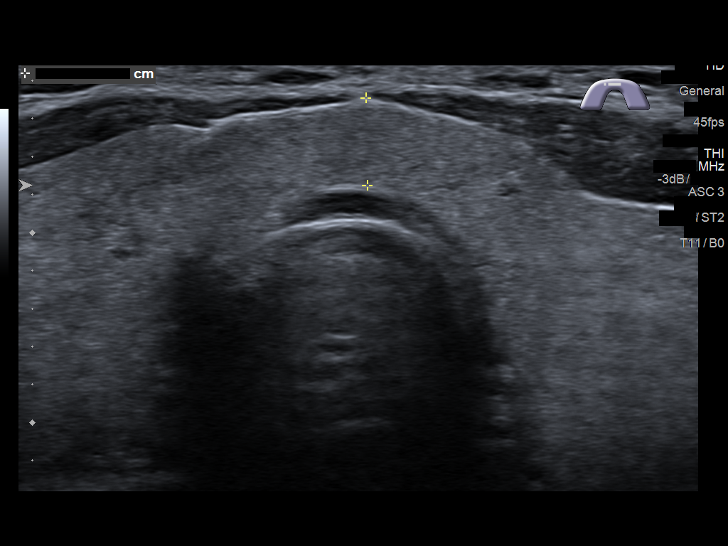

[14 of 20 positions shown; findings below may reference images not displayed]

FINDINGS: Parenchymal Echotexture: Mildly heterogenous

Isthmus: 0.5 cm, previously 0.4 cm.

Right lobe: 6.3 x 1.6 x 1.7 cm, previously 5.6 x 1.5 x 1.7 cm.

Left lobe: 5.5 x 1.4 x 2.0 cm, previously 5.4 x 1.2 x 1.8 cm.

_________________________________________________________

Estimated total number of nodules >/= 1 cm: 0

Number of spongiform nodules >/=  2 cm not described below (TR1): 0

Number of mixed cystic and solid nodules >/= 1.5 cm not described
below (TR2): 0

No discrete nodules are seen within the thyroid gland.
IMPRESSION: Gland remains enlarged without focal nodule. This study does not
meet criteria for follow-up or biopsy.

The above is in keeping with the ACR TI-RADS recommendations - [HOSPITAL] 0203;[DATE].

## 2018-03-04 ENCOUNTER — Other Ambulatory Visit: Payer: Self-pay | Admitting: Family Medicine

## 2018-03-04 DIAGNOSIS — Z1231 Encounter for screening mammogram for malignant neoplasm of breast: Secondary | ICD-10-CM

## 2018-04-08 ENCOUNTER — Encounter: Payer: Self-pay | Admitting: Family Medicine

## 2018-04-12 ENCOUNTER — Inpatient Hospital Stay: Admission: RE | Admit: 2018-04-12 | Payer: Self-pay | Source: Ambulatory Visit

## 2018-04-22 ENCOUNTER — Ambulatory Visit: Payer: Self-pay | Admitting: Family Medicine

## 2018-09-12 ENCOUNTER — Encounter: Payer: Self-pay | Admitting: Family Medicine

## 2018-10-06 ENCOUNTER — Other Ambulatory Visit (HOSPITAL_BASED_OUTPATIENT_CLINIC_OR_DEPARTMENT_OTHER): Payer: Self-pay | Admitting: Family Medicine

## 2018-10-06 DIAGNOSIS — Z1231 Encounter for screening mammogram for malignant neoplasm of breast: Secondary | ICD-10-CM

## 2018-10-12 ENCOUNTER — Encounter (HOSPITAL_BASED_OUTPATIENT_CLINIC_OR_DEPARTMENT_OTHER): Payer: Managed Care, Other (non HMO)

## 2019-01-03 ENCOUNTER — Encounter: Payer: Managed Care, Other (non HMO) | Admitting: Family Medicine
